# Patient Record
Sex: Female | Born: 1960 | Hispanic: Yes | Marital: Married | Smoking: Never smoker
Health system: Southern US, Community
[De-identification: ages and names within clinical notes are randomized; demographics above are authoritative.]

## PROBLEM LIST (undated history)

## (undated) DIAGNOSIS — I10 Essential (primary) hypertension: Secondary | ICD-10-CM

## (undated) DIAGNOSIS — E669 Obesity, unspecified: Secondary | ICD-10-CM

## (undated) HISTORY — PX: SHOULDER SURGERY: SHX246

---

## 1999-06-06 ENCOUNTER — Emergency Department (HOSPITAL_COMMUNITY): Admission: EM | Admit: 1999-06-06 | Discharge: 1999-06-06 | Payer: Self-pay | Admitting: Emergency Medicine

## 1999-06-20 ENCOUNTER — Encounter: Admission: RE | Admit: 1999-06-20 | Discharge: 1999-06-20 | Payer: Self-pay | Admitting: Obstetrics & Gynecology

## 1999-10-07 ENCOUNTER — Emergency Department (HOSPITAL_COMMUNITY): Admission: EM | Admit: 1999-10-07 | Discharge: 1999-10-07 | Payer: Self-pay | Admitting: Emergency Medicine

## 1999-10-10 ENCOUNTER — Encounter: Admission: RE | Admit: 1999-10-10 | Discharge: 1999-10-10 | Payer: Self-pay | Admitting: Obstetrics & Gynecology

## 1999-10-18 ENCOUNTER — Encounter: Payer: Self-pay | Admitting: Emergency Medicine

## 1999-10-18 ENCOUNTER — Emergency Department (HOSPITAL_COMMUNITY): Admission: EM | Admit: 1999-10-18 | Discharge: 1999-10-18 | Payer: Self-pay | Admitting: Emergency Medicine

## 2004-03-20 ENCOUNTER — Emergency Department (HOSPITAL_COMMUNITY): Admission: EM | Admit: 2004-03-20 | Discharge: 2004-03-20 | Payer: Self-pay | Admitting: Emergency Medicine

## 2004-06-23 ENCOUNTER — Ambulatory Visit (HOSPITAL_BASED_OUTPATIENT_CLINIC_OR_DEPARTMENT_OTHER): Admission: RE | Admit: 2004-06-23 | Discharge: 2004-06-23 | Payer: Self-pay | Admitting: Orthopedic Surgery

## 2008-10-17 ENCOUNTER — Emergency Department (HOSPITAL_COMMUNITY): Admission: EM | Admit: 2008-10-17 | Discharge: 2008-10-17 | Payer: Self-pay | Admitting: Emergency Medicine

## 2009-05-24 ENCOUNTER — Emergency Department (HOSPITAL_COMMUNITY): Admission: EM | Admit: 2009-05-24 | Discharge: 2009-05-24 | Payer: Self-pay | Admitting: Emergency Medicine

## 2009-12-12 ENCOUNTER — Emergency Department (HOSPITAL_COMMUNITY): Admission: EM | Admit: 2009-12-12 | Discharge: 2009-12-12 | Payer: Self-pay | Admitting: Emergency Medicine

## 2009-12-14 ENCOUNTER — Emergency Department (HOSPITAL_COMMUNITY): Admission: EM | Admit: 2009-12-14 | Discharge: 2009-12-14 | Payer: Self-pay | Admitting: Emergency Medicine

## 2009-12-16 ENCOUNTER — Emergency Department (HOSPITAL_COMMUNITY): Admission: EM | Admit: 2009-12-16 | Discharge: 2009-12-16 | Payer: Self-pay | Admitting: Emergency Medicine

## 2010-12-08 LAB — URINALYSIS, ROUTINE W REFLEX MICROSCOPIC
Bilirubin Urine: NEGATIVE
Glucose, UA: NEGATIVE mg/dL
Ketones, ur: NEGATIVE mg/dL
Leukocytes, UA: NEGATIVE
Nitrite: NEGATIVE
Protein, ur: NEGATIVE mg/dL
Specific Gravity, Urine: 1.022 (ref 1.005–1.030)
Urobilinogen, UA: 0.2 mg/dL (ref 0.0–1.0)
pH: 6.5 (ref 5.0–8.0)

## 2010-12-08 LAB — DIFFERENTIAL
Basophils Relative: 0 % (ref 0–1)
Eosinophils Relative: 2 % (ref 0–5)
Monocytes Absolute: 1 10*3/uL (ref 0.1–1.0)
Monocytes Relative: 9 % (ref 3–12)
Neutro Abs: 5.7 10*3/uL (ref 1.7–7.7)

## 2010-12-08 LAB — CBC
HCT: 38.8 % (ref 36.0–46.0)
Hemoglobin: 13.3 g/dL (ref 12.0–15.0)
MCHC: 34.3 g/dL (ref 30.0–36.0)
MCV: 93 fL (ref 78.0–100.0)
RBC: 4.17 MIL/uL (ref 3.87–5.11)

## 2010-12-08 LAB — BASIC METABOLIC PANEL
CO2: 24 mEq/L (ref 19–32)
Calcium: 8.9 mg/dL (ref 8.4–10.5)
Chloride: 106 mEq/L (ref 96–112)
GFR calc Af Amer: >60 mL/min (ref >60)
Potassium: 3.7 mEq/L (ref 3.5–5.1)
Sodium: 137 mEq/L (ref 135–145)

## 2010-12-08 LAB — RPR: RPR Ser Ql: NONREACTIVE

## 2010-12-19 LAB — URINALYSIS, ROUTINE W REFLEX MICROSCOPIC
Bilirubin Urine: NEGATIVE
Glucose, UA: NEGATIVE mg/dL
Ketones, ur: NEGATIVE mg/dL
Leukocytes, UA: NEGATIVE
Nitrite: NEGATIVE
Protein, ur: NEGATIVE mg/dL
Specific Gravity, Urine: 1.019 (ref 1.005–1.030)
Urobilinogen, UA: 0.2 mg/dL (ref 0.0–1.0)
pH: 6 (ref 5.0–8.0)

## 2010-12-19 LAB — POCT I-STAT, CHEM 8
BUN: 8 mg/dL (ref 6–23)
Calcium, Ion: 1.18 mmol/L (ref 1.12–1.32)
Chloride: 106 mEq/L (ref 96–112)
Creatinine, Ser: 0.4 mg/dL (ref 0.4–1.2)
Glucose, Bld: 81 mg/dL (ref 70–99)
HCT: 37 % (ref 36.0–46.0)
Hemoglobin: 12.6 g/dL (ref 12.0–15.0)
Potassium: 4 mEq/L (ref 3.5–5.1)
Sodium: 140 mEq/L (ref 135–145)
TCO2: 22 mmol/L (ref 0–100)

## 2010-12-19 LAB — WET PREP, GENITAL
WBC, Wet Prep HPF POC: NONE SEEN
Yeast Wet Prep HPF POC: NONE SEEN

## 2010-12-19 LAB — POCT PREGNANCY, URINE: Preg Test, Ur: NEGATIVE

## 2010-12-19 LAB — GC/CHLAMYDIA PROBE AMP, GENITAL
Chlamydia, DNA Probe: NEGATIVE
GC Probe Amp, Genital: NEGATIVE

## 2011-01-19 NOTE — Op Note (Signed)
[name], [name] NO.:  [id number]   MEDICAL RECORD NO.:  [id number]          PATIENT TYPE:  AMB   LOCATION:  DSC                          FACILITY:  [facility]   PHYSICIAN:  Harvie Junior, M.D.   DATE OF BIRTH:  Apr 09, 1961   DATE OF PROCEDURE:  06/23/2004  DATE OF DISCHARGE:                                 OPERATIVE REPORT   PREOPERATIVE DIAGNOSES:  1.  Impingement.  2.  Acromioclavicular joint arthritis.  3.  Partial-thickness rotator cuff tear.   POSTOPERATIVE DIAGNOSES:  1.  Impingement.  2.  Acromioclavicular joint arthritis.  3.  Partial-thickness rotator cuff tear.  4.  Labral pathology with probable old biceps tendon tear.   PRINCIPAL PROCEDURES:  1.  Anterolateral acromioplasty through a lateral and posterior compartment.  2.  Distal clavicle resection through an anterior compartment.  3.  Debridement of superior labral tear and biceps tendon and undersurface      rotator cuff tear from within the glenohumeral joint.   SURGEON:  Harvie Junior, M.D.   ASSISTANT:  Marshia Ly, P.A.   ANESTHESIA:  General.   BRIEF HISTORY:  Ms. Colleen Jacobson is a 50 year old female with a long history of  having right shoulder pain.  She was ultimately evaluated multiple times and  noted to have impingement with pain in the shoulder.  She ultimately was  found to have a tight subacromial space.  MRI showed significant partial-  thickness rotator cuff tearing with questionable biceps tendon tear and  after failure of all conservative care, she was ultimately taken to the  operating room for operative arthroscopy with __________ as needed.   PROCEDURE:  The patient was taken to the operating room and after adequate  level of anesthesia was obtained with a general anesthetic, the patient was  positioned upon the operating table and moved to the beach chair position,  and all bony prominences were well-padded.  Attention was then turned to the  right shoulder, where  after the arm was prepped and draped in the usual  sterile fashion, a routine arthroscopic examination revealed that there was  significant anterior spurring in the acromion, and this was debrided with a  motorized bur.  The bur was used to elevate the acromion from lateral to  medial and once this was accomplished, attention was turned to the distal  clavicle, where 15 mm of distal clavicle was resected through the anterior  portal.  Following this, attention was turned to the rotator cuff superior  surface, where there was noted to be significant fray of the rotator cuff.  Checks were made for any full-thickness areas of rotator cuff, and none were  seen.  At this time attention was turned into the glenohumeral joint.  In  the glenohumeral joint there was noted to be some significant labral  pathology a well as undersurface rotator cuff tearing.  The rotator cuff was  debrided from within the glenohumeral joint, and no full-thickness tearing  was identified at this point.  The biceps tendon was identified and looked  as though it was adherent to the rotator cuff  and that it did not proceed  distally through the interval on the rotator cuff where the biceps tendon  departs the shoulder.  The superior labrum was also torn, and this was  debrided and the biceps tendon actually was left alone, adherent basically  in the subscap rotator interval.  At this point a final check was made of  the rotator cuff undersurface.  There was again significantly intact rotator  cuff and at this point, attention was turned out of the glenohumeral joint  and the portals were closed with a bandage, a sterile compressive dressing  was applied, and the patient was taken to the recovery room, where she was  noted to be in satisfactory condition.  Estimated blood loss for the  procedure was none.       JLG/MEDQ  D:  06/23/2004  T:  06/23/2004  Job:  [id number]

## 2011-08-13 ENCOUNTER — Emergency Department (HOSPITAL_COMMUNITY): Payer: Self-pay

## 2011-08-13 ENCOUNTER — Emergency Department (HOSPITAL_COMMUNITY)
Admission: EM | Admit: 2011-08-13 | Discharge: 2011-08-14 | Disposition: A | Discharge status: Home / Self Care | Payer: Self-pay | Attending: Emergency Medicine | Admitting: Emergency Medicine

## 2011-08-13 ENCOUNTER — Encounter: Payer: Self-pay | Admitting: *Deleted

## 2011-08-13 DIAGNOSIS — M545 Low back pain, unspecified: Secondary | ICD-10-CM | POA: Insufficient documentation

## 2011-08-13 DIAGNOSIS — R319 Hematuria, unspecified: Secondary | ICD-10-CM | POA: Insufficient documentation

## 2011-08-13 DIAGNOSIS — R300 Dysuria: Secondary | ICD-10-CM | POA: Insufficient documentation

## 2011-08-13 LAB — URINALYSIS, ROUTINE W REFLEX MICROSCOPIC
Glucose, UA: NEGATIVE mg/dL
Specific Gravity, Urine: 1.023 (ref 1.005–1.030)
pH: 6 (ref 5.0–8.0)

## 2011-08-13 LAB — URINE MICROSCOPIC-ADD ON

## 2011-08-13 MED ORDER — KETOROLAC TROMETHAMINE 60 MG/2ML IM SOLN
60.0000 mg | Freq: Once | INTRAMUSCULAR | Status: AC
  Administered 2011-08-13: 60 mg via INTRAMUSCULAR
  Filled 2011-08-13: qty 2

## 2011-08-13 MED ORDER — OXYCODONE-ACETAMINOPHEN 5-325 MG PO TABS
1.0000 | ORAL_TABLET | Freq: Once | ORAL | Status: AC
  Administered 2011-08-13: 1 via ORAL
  Filled 2011-08-13: qty 1

## 2011-08-13 NOTE — ED Provider Notes (Signed)
History     CSN: [id number] Arrival date & time: 08/13/2011  7:24 PM   First MD Initiated Contact with Patient 08/13/11 2144      Chief Complaint  Patient presents with  . Back Pain    pt c/o lower right back pain that began 6 days ago. pt denies injury. pt reports "inflammation" when attempting urination of defecation.     (Consider location/radiation/quality/duration/timing/severity/associated sxs/prior treatment) HPI History provided by pt.   Pt has had severe pain right lower back for the past 6 days.  Radiates to right lower abd.  Aggravated by urination.  Denies fever, N/V, urinary and vaginal sx.  No known injury or recent heavy lifting.  H/o kidney stone and this pain feels similar w/ exception that it was isolated to back last time.  History reviewed. No pertinent past medical history.  History reviewed. No pertinent past surgical history.  History reviewed. No pertinent family history.  History  Substance Use Topics  . Smoking status: Never Smoker   . Smokeless tobacco: Not on file  . Alcohol Use: No    OB History    Grav Para Term Preterm Abortions TAB SAB Ect Mult Living                  Review of Systems  All other systems reviewed and are negative.    Allergies  Review of patient's allergies indicates no known allergies.  Home Medications   Current Outpatient Rx  Name Route Sig Dispense Refill  . IBUPROFEN 200 MG PO TABS Oral Take 600 mg by mouth every 6 (six) hours as needed. For pain.       BP 142/77  Pulse 85  Temp(Src) 98.8 F (37.1 C) (Oral)  Resp 18  Wt 200 lb (90.719 kg)  SpO2 98%  Physical Exam  Nursing note and vitals reviewed. Constitutional: She is oriented to person, place, and time. She appears well-developed and well-nourished.  HENT:  Head: Normocephalic and atraumatic.  Eyes:       Normal appearance  Neck: Normal range of motion.  Cardiovascular: Normal rate and regular rhythm.   Pulmonary/Chest: Effort normal and  breath sounds normal.  Abdominal: Soft. Bowel sounds are normal. She exhibits no distension.       Obese; mild ttp RLQ  Genitourinary:       Nml external genitalia.  No vaginal discharge/bleeding.  Cervix closed and appears nml.  No cervical motion tenderness.  Mild right adnexal tenderness.  Pt reports that it reproduces her pain and can feel it in her back as well.    Musculoskeletal:       Lumbar spine non-tender. R CVA ttp. Full ROM of LE.  Nml patellar reflexes.  Distal sensation intact.  2+ DP pulses.    Neurological: She is alert and oriented to person, place, and time.  Skin: Skin is warm and dry. No rash noted.  Psychiatric: She has a normal mood and affect. Her behavior is normal.    ED Course  Procedures (including critical care time)  Labs Reviewed  URINALYSIS, ROUTINE W REFLEX MICROSCOPIC - Abnormal; Notable for the following:    Hgb urine dipstick MODERATE (*)    All other components within normal limits  WET PREP, GENITAL - Abnormal; Notable for the following:    Clue Cells, Wet Prep FEW (*)    All other components within normal limits  URINE MICROSCOPIC-ADD ON  GC/CHLAMYDIA PROBE AMP, GENITAL   Ct Abdomen Pelvis Wo Contrast  08/13/2011  *  RADIOLOGY REPORT*  Clinical Data: Right-sided pain.  Rule out stones.  CT ABDOMEN AND PELVIS WITHOUT CONTRAST  Technique:  Multidetector CT imaging of the abdomen and pelvis was performed following the standard protocol without intravenous contrast.  Comparison: 05/24/2009  Findings: Mild dependent atelectasis in the lung bases.  4 mm stone in the upper pole of the left kidney.  The kidneys are otherwise symmetrical.  No pyelocaliectasis or ureterectasis.  No stones identified in the right kidney, renal collecting systems, ureters, or the bladder.  Unenhanced images of the liver, spleen, contracted gallbladder, pancreas, adrenal glands, abdominal aorta, and retroperitoneal lymph nodes are unremarkable.  The stomach and small bowel are  decompressed.  No free fluid or free air in the abdomen.  Stool filled colon without significant wall thickening or distension. Small umbilical hernia containing fat.  Pelvis:  The uterus and adnexal structures are not enlarged. Calcified phlebolith.  Bladder wall is not thickened.  No free or loculated pelvic fluid collections.  Scattered diverticula in the sigmoid colon without inflammatory process.  The appendix is normal.  Degenerative changes in the lumbar spine.  IMPRESSION: Nonobstructing stone in the upper pole of the left kidney.  No ureteral stone or obstruction.  Small umbilical hernia containing fat.  Original Report Authenticated By: Marlon Pel, M.D.     1. Low back pain   2. Hematuria       MDM  Pt w/ h/o kidney stone but otherwise healthy presents w/ non-traumatic right low back pain that radiates to RLQ.  Feels similar to past kidney stone w/ exception of location.  On initial exam, afebrile, right CVA and mild RLQ ttp.  U/a pos for lg hgb.  CT abd/pelvis neg for ureteral stone. Appendix appears nml and ovaries nml size. Results discussed w/ pt.  On re-examination abd, diffuse ttp right abd but worse in right suprapubic.  Pt reports sharp pains in this location.  Pelvic performed and sig for mild right adnexal tenderness.   Wet prep unremarkable.  Pain may be musculoskeletal, passed kidney stone or less likely, ovarian pain.  Referred to urology as well as gynecology.  Prescribed vicodin for pain.  Return precautions discussed.         Arie Sabina [place], [place] 08/14/11 [phone]

## 2011-08-14 LAB — WET PREP, GENITAL: Trich, Wet Prep: NONE SEEN

## 2011-08-14 MED ORDER — HYDROCODONE-ACETAMINOPHEN 5-325 MG PO TABS: 1.0000 | ORAL_TABLET | ORAL | Status: AC | PRN

## 2011-08-15 NOTE — ED Provider Notes (Signed)
Medical screening examination/treatment/procedure(s) were performed by non-physician practitioner and as supervising physician I was immediately available for consultation/collaboration.  Aunika Kirsten L Jacques Fife, MD 08/15/11 2355 

## 2013-03-09 ENCOUNTER — Emergency Department (HOSPITAL_COMMUNITY)
Admission: EM | Admit: 2013-03-09 | Discharge: 2013-03-09 | Disposition: A | Discharge status: Home / Self Care | Payer: Self-pay | Attending: Emergency Medicine | Admitting: Emergency Medicine

## 2013-03-09 ENCOUNTER — Encounter (HOSPITAL_COMMUNITY): Payer: Self-pay | Admitting: Emergency Medicine

## 2013-03-09 ENCOUNTER — Emergency Department (HOSPITAL_COMMUNITY): Payer: Self-pay

## 2013-03-09 DIAGNOSIS — Y990 Civilian activity done for income or pay: Secondary | ICD-10-CM | POA: Insufficient documentation

## 2013-03-09 DIAGNOSIS — R296 Repeated falls: Secondary | ICD-10-CM | POA: Insufficient documentation

## 2013-03-09 DIAGNOSIS — Y9289 Other specified places as the place of occurrence of the external cause: Secondary | ICD-10-CM | POA: Insufficient documentation

## 2013-03-09 DIAGNOSIS — S63659A Sprain of metacarpophalangeal joint of unspecified finger, initial encounter: Secondary | ICD-10-CM | POA: Insufficient documentation

## 2013-03-09 DIAGNOSIS — S63509A Unspecified sprain of unspecified wrist, initial encounter: Secondary | ICD-10-CM | POA: Insufficient documentation

## 2013-03-09 DIAGNOSIS — Y9389 Activity, other specified: Secondary | ICD-10-CM | POA: Insufficient documentation

## 2013-03-09 MED ORDER — IBUPROFEN 200 MG PO TABS: 600.0000 mg | ORAL_TABLET | Freq: Four times a day (QID) | ORAL | Status: DC | PRN

## 2013-03-09 NOTE — ED Provider Notes (Signed)
History  This chart was scribed for Antony Madura - PA by Manuela Schwartz, ED scribe. This patient was seen in room WTR6/WTR6 and the patient's care was started at 1850.  CSN: [id number] Arrival date & time 03/09/13  3086  First MD Initiated Contact with Patient 03/09/13 1850     Chief Complaint  Patient presents with  . Fall  . Wrist Pain    left   Patient is a 52 y.o. female presenting with fall and wrist pain. The history is provided by the patient. No language interpreter was used.  Fall This is a new problem. The current episode started more than 2 days ago. The problem occurs constantly. The problem has not changed since onset.Exacerbated by: pressure to her wrist. Nothing relieves the symptoms. She has tried a cold compress, a warm compress and ASA for the symptoms. The treatment provided mild relief.  Wrist Pain This is a new problem. The current episode started more than 2 days ago. The problem occurs constantly. The problem has not changed since onset.Nothing aggravates the symptoms. Nothing relieves the symptoms. She has tried nothing for the symptoms.  Fall This is a new problem. The current episode started more than 2 days ago. The problem occurs constantly. The problem has not changed since onset.Associated symptoms include arthralgias, joint swelling and myalgias. Pertinent negatives include no fever, numbness or weakness. Exacerbated by: pressure to her wrist. She has tried a cold compress, a warm compress and ASA for the symptoms. The treatment provided mild relief.  Wrist Pain This is a new problem. The current episode started more than 2 days ago. The problem occurs constantly. The problem has not changed since onset.Associated symptoms include arthralgias, joint swelling and myalgias. Pertinent negatives include no fever, numbness or weakness. Nothing aggravates the symptoms. She has tried nothing for the symptoms.   HPI Comments: Regina Ganci is a 52 y.o. female who  presents to the Emergency Department complaining of constant, aching, left wrist pain after a fall at work w/outstretched left arm 1 week ago. She states pain is worse with applying pressure especially to volar aspect of her left wrist and thenar eminence. She has tried ibuprofen and ice/heat packs w/minimal relief of pain. She has not yet seen anyone for this problem. She denies any other injuries.  History reviewed. No pertinent past medical history. History reviewed. No pertinent past surgical history. No family history on file. History  Substance Use Topics  . Smoking status: Never Smoker   . Smokeless tobacco: Not on file  . Alcohol Use: No   OB History   Grav Para Term Preterm Abortions TAB SAB Ect Mult Living                 Review of Systems  Constitutional: Negative for fever.  Musculoskeletal: Positive for myalgias, joint swelling and arthralgias. Negative for back pain.       Left wrist pain/swelling  Skin: Negative for color change and pallor.  Neurological: Negative for syncope, weakness and numbness.  All other systems reviewed and are negative.  A complete 10 system review of systems was obtained and all systems are negative except as noted in the HPI and PMH.   Allergies  Review of patient's allergies indicates no known allergies.  Home Medications   Current Outpatient Rx  Name  Route  Sig  Dispense  Refill  . ibuprofen (ADVIL,MOTRIN) 200 MG tablet   Oral   Take 3 tablets (600 mg total) by mouth every  6 (six) hours as needed. For pain.   30 tablet   0    Triage Vitals: BP 147/78  Pulse 98  Temp(Src) 98.8 F (37.1 C) (Oral)  Resp 19  SpO2 97% Physical Exam  Nursing note and vitals reviewed. Constitutional: She is oriented to person, place, and time. She appears well-developed and well-nourished. No distress.  HENT:  Head: Normocephalic and atraumatic.  Eyes: Conjunctivae and EOM are normal. No scleral icterus.  Neck: Normal range of motion.   Cardiovascular: Normal rate, regular rhythm and intact distal pulses.   Distal radial pulses 2+ bilaterally. Capillary refill normal.  Pulmonary/Chest: Effort normal. No respiratory distress.  Musculoskeletal: Normal range of motion. She exhibits tenderness.  TTP over 1st and 2nd MCP joints of left hand and TTP to DIP of left thumb. 5/5 strength against resistance of fdp fds, and extensors of left hand. No swelling, redness, or heat to touch.   Neurological: She is alert and oriented to person, place, and time.  No sensory or motor deficits appreciated. Finger to thumb opposition intact and left hand  Skin: Skin is warm and dry. No rash noted. She is not diaphoretic. No erythema. No pallor.  Psychiatric: She has a normal mood and affect. Her behavior is normal.   ED Course  Procedures (including critical care time) DIAGNOSTIC STUDIES: Oxygen Saturation is 97% on room air, normal by my interpretation.    COORDINATION OF CARE: At 58 PM Discussed treatment plan with patient which includes left hand X-ray. Patient agrees.   Labs Reviewed - No data to display Dg Hand Complete Left  03/09/2013   *RADIOLOGY REPORT*  Clinical Data: Fall.  Left hand injury and pain.  LEFT HAND - COMPLETE 3+ VIEW  Comparison: None.  Findings: No evidence of fracture or dislocation.  No other significant bone abnormality identified.  Soft tissues are unremarkable.  IMPRESSION: No acute findings.   Original Report Authenticated By: Myles Rosenthal, M.D.   1. Hand sprain and strain, left, initial encounter    MDM  Uncomplicated sprain of left wrist and thenar eminence secondary to fall at work. Patient neurovascularly intact; physical exam as above. No erythema or heat-to-touch to suspect complicated or infectious joint process. X-ray with no findings of fracture or dislocation. Thumb spica splint applied in ED for stability. Patient appropriate for discharge with instruction to take ibuprofen as needed and to apply ice  at least 4 times day for inflammation. Orthopedic referral provided. Indications for ED return discussed with the patient verbalized comfort and understanding with this discharge plan.  I personally performed the services described in this documentation, which was scribed in my presence. The recorded information has been reviewed and is accurate.     Antony Madura, PA-C 03/10/13 0140

## 2013-03-09 NOTE — ED Notes (Signed)
Pt fell last Tuesday, pt c/o left wrist pain.

## 2013-03-09 NOTE — Discharge Instructions (Signed)
Recommend that you where your thumb and wrist brace for added stability. Take Tylenol as prescribed for pain. Apply ice to the affected area for the first 2 days followed by alternating ice and heat packs. Followup with your primary care provider as needed and follow up with a hand specialist in 7-10 days if symptoms do not begin to improve. Return to the emergency department if symptoms worsen.  Intermetacarpal Sprain The intermetacarpal ligaments run between the knuckles, at the base of the fingers. These ligaments are vulnerable to sprain and injury, in which the ligament becomes over stretched or torn. Intermetacarpal sprains are classified into 3 categories. Grade 1 sprains cause pain, but the tendon is not lengthened. Grade 2 sprains include a lengthened ligament, due to the ligament being stretched or partially ruptured. With grade 2 sprains there is still function, although function may be decreased. Grade 3 sprains include a complete tear of the ligament, and the joint usually displays a loss of function.  SYMPTOMS   Severe pain at the time of injury.  Often, a feeling of popping or tearing inside the hand.  Tenderness and inflammation at the knuckles.  Bruising within a couple days of injury.  Impaired ability to use the hand. CAUSES  This condition occurs when the intermeatacarpal ligaments are subjected to a greater stress than they can handle. This causes the ligaments to become stretched or torn. RISK INCREASES WITH:  Previous hand injury.  Fighting sports (boxing, wrestling, martial arts).  Sports in which you could fall on an outstretched hand (soccer, basketball, volleyball).  Other sports with repeated hand trauma (water polo, gymnastics).  Poor hand strength and flexibility.  Inadequate or poorly fitted protective equipment. PREVENTION   Warm up and stretch properly before activity.  Maintain appropriate conditioning:  Hand flexibility.  Muscle strength and  endurance.  Applying tape, protective strapping, or a brace may help prevent injury.  Provide the hand with support during sports and practice activities, for 6 to 12 months following injury. PROGNOSIS  With proper treatment, healing should occur without impairment. The length of healing varies from 2 to 12 weeks, depending on the severity of injury. RELATED COMPLICATIONS   Longer healing time, if activities are resumed too soon.  Recurring symptoms or repeated injury, resulting in a chronic problem.  Injury to other nearby structures (bone, cartilage, tendon).  Arthritis of the knuckle (intermetacarpal) joint, with repeated sprains.  Prolonged disability (sometimes).  Hand and finger stiffness or weakness. TREATMENT Treatment first involves ice and medicine, to reduce pain and inflammation. An elastic compression bandage may be worn, to reduce discomfort and to protect the area. Depending on the severity of injury, you may be required to restrain the area with a cast, splint, or brace. After the ligament has been allowed to heal, strengthening and stretching exercises may be needed, to regain strength and a full range of motion. Exercises may be completed at home or with a therapist. Surgery is rarely needed. MEDICATION   If pain medicine is needed, nonsteroidal anti-inflammatory medicines (aspirin and ibuprofen), or other minor pain relievers (acetaminophen), are often advised.  Do not take pain medicine for 7 days before surgery.  Stronger pain relievers may be prescribed, if your caregiver thinks they are needed. Use only as directed and only as much as you need. HEAT AND COLD  Cold treatment (icing) should be applied for 10 to 15 minutes every 2 to 3 hours for inflammation and pain, and immediately after activity that aggravates your symptoms.  Use ice packs or an ice massage.  Heat treatment may be used before performing stretching and strengthening activities prescribed by your  caregiver, physical therapist, or athletic trainer. Use a heat pack or a warm water soak. SEEK MEDICAL CARE IF:   Symptoms remain or get worse, despite treatment for longer than 2 to 4 weeks.  You experience pain, numbness, discoloration, or coldness in the hand or fingers.  You develop blue, gray, or dark fingernails.  Any of the following occur after surgery: increased pain, swelling, redness, drainage of fluids, bleeding in the affected area, or signs of infection, including fever.  New, unexplained symptoms develop. (Drugs used in treatment may produce side effects.) Document Released: 08/20/2005 Document Revised: 11/12/2011 Document Reviewed: 12/02/2008 [facility] Patient Information 2014 [place], [place]. RESOURCE GUIDE  Chronic Pain Problems: Contact [facility]  [phone] Patients need to be referred by their primary care doctor.  Insufficient Money for Medicine: Contact United Way:  call "211."   No Primary Care Doctor: - Call [facility]  [phone] - can help you locate a primary care doctor that  accepts your insurance, provides certain services, etc. - Physician Referral Service- [phone]  Agencies that provide inexpensive medical care: - [facility]  [phone] - [facility]  [phone] - [facility]  [phone] - Women's Clinic  [phone] - Planned Parenthood  [phone] [facility]  [phone]  Medicaid-accepting [facility] Providers: - [facility]- [address]  [phone], Mon-Fri 9am-7pm, Sat 9am-1pm - [facility]- [address] [place], [address] [place]  [phone] - [facility]- [address] [place]  [phone] [facility]- [address]  [phone] - Renaye Rakers- [address] [place], [address], [phone]  Only accepts [place] patients after they have their name  applied to  their card  Self Pay (no insurance) in [place]: - Sickle Cell Patients - [facility]  [address] [place], [phone] - [facility]- [address] [place]  [phone]       [facility]- [address]       -     [facility]- see information above (Speak to [employer] if you do not have insurance)       -  [facility]- [address],  [phone]       -  [facility]- [address] [phone]       -  Dr Julio Sicks-  [address], [place], [phone]       -  [facility] - [address], [phone]       -  [facility]- [address], [phone], also [address] [phone]       -     [facility]- [address] [place], [phone], 1st & 3rd Saturday         every month, 10am-1pm  -     [facility]   [address] [place], [place].   Phone:  [phone], Fax:  [phone]. Hours of Operation:  9 am - 6 pm, M-F.  -     [facility]   [address], [address], [address]  Phone: [phone], Fax: [phone]. Hours of Operation:  8:30 am - 5:30 pm, M-F.  [facility] [address] [place], [place] [address] [phone]  [facility] [address] [place], [place] [address] [phone]  1) Find a Doctor and Pay Out of Pocket Although you won't have to find out who is covered by your insurance plan, it is a good idea to ask around and get recommendations. You will then need to call the office and see if the doctor you have chosen will accept you as a new patient and what types of options they offer for patients who are self-pay. Some doctors offer discounts or will set up payment plans for their patients who do not have insurance, but you will need to ask so you aren't surprised when you get to your appointment.  2) Contact Your  Local Health Department Not all health departments have doctors that can see patients for sick visits, but many do, so it is worth a call to see if yours does. If you don't know where your local health department is, you can check in your phone book. The CDC also has a tool to help you locate your state's health department, and many state websites also have listings of all of their local health departments.  3) Find a Walk-in Clinic If your illness is not likely to be very severe or complicated, you may want to try a walk in clinic. These are popping up all over the country in pharmacies, drugstores, and shopping centers. They're usually staffed by nurse practitioners or physician assistants that have been trained to treat common illnesses and complaints. They're usually fairly quick and inexpensive. However, if you have serious medical issues or chronic medical problems, these are probably not your best option  STD Testing - [facility] Department of [facility], STD Clinic, [address] [place], phone [phone] or [phone].  Monday - Friday, call for an appointment. [facility] Department of [employer], STD Clinic, [address], [place], phone [phone] or [phone].  Monday - Friday, call for an appointment.  Abuse/Neglect: [facility] Child Abuse Hotline [phone] [facility] Child Abuse Hotline [phone] (After Hours)  Emergency Shelter:  [facility] [phone]  Maternity Homes: - [facility] at [facility] [place] [facility] [phone] - [facility] [phone]  MRSA Hotline #:   332-852-3970  Dental Assistance If unable to pay or uninsured, contact:  [facility]. to become qualified for the adult dental clinic.  Patients with Medicaid: [facility] [phone], [phone] [address], [phone]  If unable to pay, or  uninsured, contact [facility] [phone] in [place], [phone] in [facility]) to become qualified for the adult dental clinic  [facility] [address] [place], [place] [address] [phone] [email or web address]  Other Proofreader Services: - [facility]- [address] [place], [place], [place], [address], [phone], Ext. [phone], 2nd and 4th Thursday of the month at 6:30am.  10 clients each day by appointment, can sometimes see walk-in patients if someone does not show for an appointment. [facility]- [address] [place], [place], [address], [phone] - [facility]- [address] [place], [place], [address], [phone] - [facility]- [phone] [facility]- [phone] [facility]- [phone]

## 2013-03-10 NOTE — ED Provider Notes (Signed)
Medical screening examination/treatment/procedure(s) were performed by non-physician practitioner and as supervising physician I was immediately available for consultation/collaboration.   Siyah Mault M Azriella Mattia, MD 03/10/13 2103 

## 2014-01-22 ENCOUNTER — Emergency Department (HOSPITAL_COMMUNITY): Payer: Self-pay

## 2014-01-22 ENCOUNTER — Encounter (HOSPITAL_COMMUNITY): Payer: Self-pay | Admitting: Emergency Medicine

## 2014-01-22 ENCOUNTER — Emergency Department (HOSPITAL_COMMUNITY)
Admission: EM | Admit: 2014-01-22 | Discharge: 2014-01-23 | Disposition: A | Discharge status: Home / Self Care | Payer: Self-pay | Attending: Emergency Medicine | Admitting: Emergency Medicine

## 2014-01-22 DIAGNOSIS — S8392XA Sprain of unspecified site of left knee, initial encounter: Secondary | ICD-10-CM

## 2014-01-22 DIAGNOSIS — W1809XA Striking against other object with subsequent fall, initial encounter: Secondary | ICD-10-CM | POA: Insufficient documentation

## 2014-01-22 DIAGNOSIS — IMO0002 Reserved for concepts with insufficient information to code with codable children: Secondary | ICD-10-CM | POA: Insufficient documentation

## 2014-01-22 DIAGNOSIS — Y9301 Activity, walking, marching and hiking: Secondary | ICD-10-CM | POA: Insufficient documentation

## 2014-01-22 DIAGNOSIS — Y92009 Unspecified place in unspecified non-institutional (private) residence as the place of occurrence of the external cause: Secondary | ICD-10-CM | POA: Insufficient documentation

## 2014-01-22 MED ORDER — OXYCODONE-ACETAMINOPHEN 5-325 MG PO TABS
2.0000 | ORAL_TABLET | Freq: Once | ORAL | Status: AC
  Administered 2014-01-22: 2 via ORAL
  Filled 2014-01-22: qty 2

## 2014-01-22 MED ORDER — METHOCARBAMOL 750 MG PO TABS: 750.0000 mg | ORAL_TABLET | Freq: Four times a day (QID) | ORAL | Status: DC

## 2014-01-22 MED ORDER — OXYCODONE-ACETAMINOPHEN 5-325 MG PO TABS
2.0000 | ORAL_TABLET | ORAL | Status: DC | PRN
Start: 2014-01-22 — End: 2014-02-19

## 2014-01-22 NOTE — Discharge Instructions (Signed)
Esguince de rodilla ( Knee Sprain) Un esguince de rodilla es un desgarro en uno de los tejidos fuertes y fibrosos (ligamentos) que conectan los huesos de la rodilla. La gravedad del esguince depende de cunto ligamento se rompe. La ruptura puede ser parcial o completa. CAUSAS  A menudo, los esguinces son el resultado de una cada o una lesin. La fuerza del impacto hace que las fibras del ligamento se estiren ms de su largo normal. Este exceso de tensin es la causa de que las fibras del ligamento se rompan. SIGNOS Y SNTOMAS  Es posible que pierda el movimiento de la rodilla. Otros sntomas son:  Moretones.  Dolor en la zona de la rodilla.  Sensibilidad de la rodilla al tacto.  Hinchazn. DIAGNSTICO  Para diagnosticar un esguince de rodilla, su mdico le har un examen fsico de la rodilla. Adems, puede indicarle que se haga una radiografa de la rodilla para asegurarse de que no haya huesos fracturados. TRATAMIENTO  Si el ligamento est parcialmente roto, el tratamiento, habitualmente, consiste en mantener la rodilla en una posicin fija (inmovilizacin) o en usar un soporte durante algunas semanas cuando realice actividades que requieran movimiento. Para ello, su mdico colocar un vendaje, un yeso o una frula para impedir que la rodilla se Armed forces technical officermueva y para que le brinde apoyo durante los movimientos hasta que esta se cure. En el caso de un ligamento parcialmente roto, el proceso de curacin generalmente demora de 4 a 6semanas. Si el ligamento est completamente roto, segn de qu ligamento se trate, podr necesitar una ciruga para volver a unirlo al hueso o para Copywriter, advertisingreconstruirlo. Despus de la ciruga, Musicianle colocarn un yeso o una frula que no podr quitarse durante 4 a 6semanas mientras el ligamento se [place]. INSTRUCCIONES PARA EL CUIDADO EN EL HOGAR  Mantenga elevada la rodilla lesionada para disminuir la hinchazn.  Para aliviar el dolor y la hinchazn, aplique hielo en la zona de la  lesin:  Ponga el hielo en una bolsa plstica.  Colquese una toalla entre la piel y la bolsa de hielo.  Deje el hielo durante 20 minutos, 2 a 3 veces por da.  Tome los analgsicos nicamente como le indic su mdico.  No deje la rodilla sin proteccin hasta que el dolor y la rigidez desaparezcan (generalmente en el trmino de 4 a 6semanas).  Si tiene puesto un yeso o una frula, no deje que se moje. Si le han indicado que no puede quitrselo, cbralo con una bolsa plstica para darse Neomia Dearuna ducha o un bao. No practique natacin.  Su mdico puede indicarle ejercicios para que haga durante la recuperacin, a fin de Agricultural engineerevitar o limitar la debilidad y la rigidez permanentes. SOLICITE ATENCIN MDICA DE INMEDIATO SI:  El yeso o la frula se daan.  El dolor Moscow Millsempeora.  Tiene dolor intenso, hinchazn o adormecimiento debajo del yeso o la frula. ASEGRESE DE QUE:  Comprende estas instrucciones.  Controlar su afeccin.  Recibir ayuda de inmediato si no mejora o si empeora. Document Released: 08/20/2005 Document Revised: 06/10/2013 [facility] Patient Information 2014 [place], [place]. Uso de Murphy Oilmuletas Administrator(Crutch Use) Las Murphy Oilmuletas se utilizan para Paramedicaliviar el peso de [place] de sus piernas o pies cuando est parado o camina. Es importante usar Pulte Homesmuletas que le calcen Flovillaadecuadamente. Las Murphy Oilmuletas calzan adecuadamente cuando:  Debe haber un espacio de 2 a 3 dedos entre cada muleta y Management consultantla axila.  El peso debe recaer en su mano y no en la axila.  RIESGOS Y COMPLICACIONES Gardiner RamusDao a los  nervios que se extiende desde la axila hacia la mano y el brazo. Para evitar que esto suceda, asegrese de que las [place] y no aplique presin en la axila cuando las [place]. CMO USAR SUS MULETAS Si le han indicado que soporte parcialmente el peso, aplique (soporte) la cantidad de peso que le sugiera su mdico. No soporte un peso que le ocasione dolor en la zona lesionada. Caminar 1. Prese con las  muletas. 2. Balancee la pierna sana levemente por delante de las muletas. Subir escalones Si no hay barandas: 1. Suba con la pierna sana. 2. Suba con las [place] y la pierna lesionada. 3. Contine de esta forma. Si hay barandas: 1. Sostenga ambas muletas en [employer]. 2. Coloque la [employer] baranda. 3. Mientras sostiene el peso con los brazos, levante la pierna sana y colquela sobre el escaln. 4. Lleve las muletas y la pierna lesionada hasta el escaln. 5. Contine de esta forma. Going Down Steps Tenga mucho cuidado, ya que bajar escaleras con muletas puede ser peligroso. Si no hay barandas: 1. Baje con la pierna lesionada y las [place]. 2. Baje con la pierna sana. Si hay barandas: 1. Coloque la [employer] baranda. 2. Sostenga ambas muletas con la [place] [employer]. 3. Baje la pierna lesionada y la [address] el escaln debajo de usted. Asegrese de [employer] puntas de las muletas en el centro del escaln, nunca en el borde. 4. Baje la pierna sana hasta ese escaln. 5. Contine de esta forma. Pararse 1. Sostenga la pierna lesionada hacia adelante. 2. Agarre el apoyabrazos con Edison Simon y la parte superior de las muletas con la [place]. 3. Utilizando estos apoyos, prese. Sentarse 1. Sostenga la pierna lesionada hacia adelante. 2. Agarre el apoyabrazos con Edison Simon y la parte superior de las muletas con la [place]. 3. Baje el cuerpo Printmaker. SOLICITE ATENCIN MDICA SI:  An se siente inestable cuando se para.  Desarrolla un nuevo dolor, por ejemplo, en las axilas, la espalda, los hombros, las muecas o la cadera.  Presenta adormecimiento u hormigueo. SOLICITE ATENCIN MDICA DE INMEDIATO SI: Se cae. Document Released: 08/20/2005 Document Revised: 06/10/2013 [facility] Patient Information 2014 [place], [place].

## 2014-01-22 NOTE — ED Provider Notes (Signed)
CSN: [id number]     Arrival date & time 01/22/14  2135 History   First MD Initiated Contact with Patient 01/22/14 2208     Chief Complaint  Patient presents with  . Leg Pain    bilateral     (Consider location/radiation/quality/duration/timing/severity/associated sxs/prior Treatment) Patient is a 53 y.o. female presenting with leg pain. The history is provided by a relative and the patient.  Leg Pain  patient here complaining of severe pain to her left knee and distal left thigh. She was walking at home and she tripped and fell and struck her left lower extremity. Denies any hip pain. No back pain. There was no loss of consciousness. Took Motrin without relief. Pain characterized as sharp and worse with movement. No prior history of same.  History reviewed. No pertinent past medical history. Past Surgical History  Procedure Laterality Date  . Shoulder surgery     History reviewed. No pertinent family history. History  Substance Use Topics  . Smoking status: Never Smoker   . Smokeless tobacco: Not on file  . Alcohol Use: No   OB History   Grav Para Term Preterm Abortions TAB SAB Ect Mult Living                 Review of Systems  All other systems reviewed and are negative.     Allergies  Review of patient's allergies indicates no known allergies.  Home Medications   Prior to Admission medications   Medication Sig Start Date End Date Taking? Authorizing Provider  ibuprofen (ADVIL,MOTRIN) 200 MG tablet Take 400 mg by mouth every 6 (six) hours as needed for moderate pain.   Yes Historical Provider, MD   BP 143/75  Pulse 94  Temp(Src) 98.4 F (36.9 C) (Oral)  Resp 20  SpO2 98% Physical Exam  Nursing note and vitals reviewed. Constitutional: She is oriented to person, place, and time. She appears well-developed and well-nourished.  Non-toxic appearance. No distress.  HENT:  Head: Normocephalic and atraumatic.  Eyes: Conjunctivae, EOM and lids are normal. Pupils are  equal, round, and reactive to light.  Neck: Normal range of motion. Neck supple. No tracheal deviation present. No mass present.  Cardiovascular: Normal rate, regular rhythm and normal heart sounds.  Exam reveals no gallop.   No murmur heard. Pulmonary/Chest: Effort normal and breath sounds normal. No stridor. No respiratory distress. She has no decreased breath sounds. She has no wheezes. She has no rhonchi. She has no rales.  Abdominal: Soft. Normal appearance and bowel sounds are normal. She exhibits no distension. There is no tenderness. There is no rebound and no CVA tenderness.  Musculoskeletal: She exhibits no edema and no tenderness.       Left knee: She exhibits decreased range of motion. She exhibits no effusion and no deformity.       Legs: Neurological: She is alert and oriented to person, place, and time. She has normal strength. No cranial nerve deficit or sensory deficit. GCS eye subscore is 4. GCS verbal subscore is 5. GCS motor subscore is 6.  Skin: Skin is warm and dry. No abrasion and no rash noted.  Psychiatric: She has a normal mood and affect. Her speech is normal and behavior is normal.    ED Course  Procedures (including critical care time) Labs Review Labs Reviewed - No data to display  Imaging Review No results found.   EKG Interpretation None      MDM   Final diagnoses:  None  Patient given Percocet for pain and she feels better. We'll discharge home with orthopedic referral and a knee immobilizer on crutches.    Toy BakerAnthony T Shaleena Crusoe, MD 01/22/14 2330

## 2014-01-22 NOTE — ED Notes (Signed)
Patient is alert and oriented x3.  She is complaining of bilateral leg pain with more pain in  The upper left leg.  The patient states that she fell on a cord.  She denies any LOC.  Currently  She rates her pain 8 of 10.  And adds that she has more pain when she tries to walk and has To hold on to the wall.

## 2014-01-22 NOTE — ED Notes (Signed)
Patient speaks very little [place], patient's grand daughter at the bedside translating.

## 2014-02-19 ENCOUNTER — Encounter (HOSPITAL_COMMUNITY): Payer: Self-pay | Admitting: Emergency Medicine

## 2014-02-19 ENCOUNTER — Emergency Department (HOSPITAL_COMMUNITY)
Admission: EM | Admit: 2014-02-19 | Discharge: 2014-02-19 | Disposition: A | Discharge status: Home / Self Care | Payer: Self-pay | Attending: Emergency Medicine | Admitting: Emergency Medicine

## 2014-02-19 DIAGNOSIS — H6091 Unspecified otitis externa, right ear: Secondary | ICD-10-CM

## 2014-02-19 DIAGNOSIS — H60399 Other infective otitis externa, unspecified ear: Secondary | ICD-10-CM | POA: Insufficient documentation

## 2014-02-19 MED ORDER — CIPROFLOXACIN-DEXAMETHASONE 0.3-0.1 % OT SUSP
4.0000 [drp] | Freq: Once | OTIC | Status: AC
  Administered 2014-02-19: 4 [drp] via OTIC
  Filled 2014-02-19: qty 7.5

## 2014-02-19 MED ORDER — ACETAMINOPHEN 325 MG PO TABS
650.0000 mg | ORAL_TABLET | Freq: Once | ORAL | Status: AC
  Administered 2014-02-19: 650 mg via ORAL
  Filled 2014-02-19: qty 2

## 2014-02-19 MED ORDER — HYDROCODONE-ACETAMINOPHEN 5-325 MG PO TABS: 1.0000 | ORAL_TABLET | Freq: Four times a day (QID) | ORAL | Status: DC | PRN

## 2014-02-19 NOTE — Discharge Instructions (Signed)
You have been diagnosed with outer ear infection of your right ear.  Each day apply a wick into your eardrum deep enough that the medication can get to your ear drum but do not advance far enough that it can injure your eardrum.  Drop 3-4 drops of antibiotic into the wick and allow it to reach your ear.  After 20 minute you may remove the ear wick.  If after 3 days and you notice no improvement, please follow up with ENT provider for further care.  Take vicodin as needed for pain.    Otitis Externa (Otitis Externa)  La otitis externa es una infeccin bacteriana o por hongos en el conducto auditivo externo. Esta es el rea desde el tmpano hasta el exterior de la [place]. Tambin se la llama "odo de nadador". CAUSAS  Las posibles causas de la infeccin son:   Alen Bleacheradar en agua sucia.  Humedad que queda en el odo despus de nadar o baarse.  Lesin leve en la oreja (traumatismo).  Objetos atascados en el odo (cuerpo extrao).  Cortes o raspones (abrasiones) en la parte exterior de la [place]. SNTOMAS  En general, la primer sntoma de infeccin es la picazn en el canal auditivo. Ms tarde, los signos y las sntomas pueden ser hinchazn y enrojecimiento del conducto auditivo, dolor de odo, y supuracin de lquido de color blanco amarillento (pus). El Engineer, miningdolor de odo puede empeorar cuando tira el lbulo de la [place].  DIAGNSTICO  El Office Depotmdico le har un examen fsico. Podr tomar Lauris Poaguna muestra de lquido de la oreja y Engineer, manufacturingdetectar bacterias u hongos.  TRATAMIENTO  Las gotas antibiticas para los odos se administran generalmente entre 10 a [address] das. El tratamiento tambin puede ser analgsicos o corticoides para reducir la comezn y la hinchazn.  PREVENCIN   Mantenga el odo seco. Use la punta de una toalla para absorber el agua del canal auditivo despus de nadar o del bao.  Evite rascarse o poner objetos en el interior del odo. Esto puede daar el conducto auditivo externo o eliminar la cera protectora  que recubre el conducto. Esto facilita el crecimiento de las bacterias y hongos.  Evite Progress Energynadar en los lagos, en agua contaminada, o en las piscinas mal cloradas.  Puede usar las gotas para los odos hechas de alcohol y vinagre despus de Programmer, systemsnadar. Mezcle en partes iguales el vinagre blanco y el alcohol en una botella. Ponga 3 o 4 gotas en cada odo despus de nadar. INSTRUCCIONES PARA EL CUIDADO EN EL HOGAR   Aplique gotas de antibitico en el conducto auditivo segn lo indicado por su mdico.  Slo tome medicamentos de venta libre o recetados para Primary school teachercalmar el dolor, las molestias o bajar la fiebre segn las indicaciones de su mdico.  Si tiene diabetes, siga las instrucciones adicionales de Holly Hilltratamiento.  Cumpla con todas las visitas de control, segn le indique su mdico. SOLICITE ATENCIN MDICA SI:   Lance Mussiene fiebre.  Su odo contina rojo, hinchado, le duele o supura pus despus de 3 das.  El dolor, la hinchazn o el enrojecimiento empeoran.  Sufre un dolor intenso de Turkmenistancabeza.  Tiene en la zona detrs de la oreja que est roja, hinchada, le duele o est sensible. ASEGRESE DE QUE:   Comprende estas instrucciones.  Controlar su enfermedad.  Solicitar ayuda de inmediato si no mejora o si empeora. Document Released: 08/20/2005 Document Revised: 11/12/2011 [facility] Patient Information 2015 [place]Care, [place]. This information is not intended to replace advice given to you by your  health care provider. Make sure you discuss any questions you have with your health care provider. ° °

## 2014-02-19 NOTE — ED Notes (Signed)
Pt c/o R ear pain for the past week. Pt states pain is worse now and she has decreased hearing in her R ear. Pt denies drainage from ear. Pt denies any other symptoms. Pt alert, no acute distress.

## 2014-02-19 NOTE — ED Provider Notes (Signed)
CSN: [id number]     Arrival date & time 02/19/14  1449 History  This chart was scribed for Fayrene HelperBowie Tran, PA-C, working with Layla MawKristen N Ward, DO, by Gwenevere AbbotAlexis Brown ED Scribe. This patient was seen in room WTR7/WTR7 and the patient's care was started at 3:01PM.  Chief Complaint  Patient presents with  . Otalgia   The history is provided by the patient and a relative. No language interpreter was used.   HPI Comments: Colleen Jacobson is a 53 y.o. female who presents to the Emergency Department complaining of right ear pain for the last week that has been constant, but became more severe last night. Pt is accompanied by a spanish-speaking family member who is providing translation. Pt states that it feels as if there is air inside of her ear and there was drainage last night. She also reports having some hearing changes from the right ear. She states that she couldn't sleep last night due to pain. Pt has recently been swimming in a pool. She states that she took medication (ibuprofen) to relieve pain, but it has not modified pain. She also states that she attempted to clean her ear with a Q-tip but this was unsuccessful. Pt states that she generally uses a Q-tip every time she takes a shower. She complains that she did have associated chills last night. Pt states that the ear pain radiates to the right side of her head. Patient denies any issues with the left ear. Pt also denies rhinorrhea, sneezing, cough or fever. Pt denies any allergies to medication.    History reviewed. No pertinent past medical history. Past Surgical History  Procedure Laterality Date  . Shoulder surgery     No family history on file. History  Substance Use Topics  . Smoking status: Never Smoker   . Smokeless tobacco: Not on file  . Alcohol Use: No   OB History   Grav Para Term Preterm Abortions TAB SAB Ect Mult Living                 Review of Systems  Constitutional: Positive for chills. Negative for fever.   HENT: Positive for ear pain (right). Negative for rhinorrhea and sneezing.   Respiratory: Negative for cough.      Allergies  Review of patient's allergies indicates no known allergies.  Home Medications   Prior to Admission medications   Medication Sig Start Date End Date Taking? Authorizing Provider  ibuprofen (ADVIL,MOTRIN) 200 MG tablet Take 400 mg by mouth every 6 (six) hours as needed for moderate pain.   Yes Historical Provider, MD  HYDROcodone-acetaminophen (NORCO/VICODIN) 5-325 MG per tablet Take 1 tablet by mouth every 6 (six) hours as needed for moderate pain or severe pain. 02/19/14   Fayrene HelperBowie Tran, PA-C   Triage Vitals: BP 167/92  Pulse 84  Temp(Src) 99.2 F (37.3 C) (Oral)  Resp 16  SpO2 96%  Physical Exam  Nursing note and vitals reviewed. Constitutional: She is oriented to person, place, and time. She appears well-developed and well-nourished. No distress.  HENT:  Head: Normocephalic and atraumatic.  Left TM dull. No erythema, no exudate.  Right canal is completely obstructed with moderate tenderness from speculum insertion. Unable to visualize the ear canal. Ear lobes are tender to manipulation. No surrounding lymphadenopathy. No evidence of mastoiditis   Eyes: Conjunctivae and EOM are normal.  Neck: Neck supple. No tracheal deviation present.  Cardiovascular: Normal rate.   Pulmonary/Chest: Effort normal. No respiratory distress.  Musculoskeletal: Normal range  of motion.  Neurological: She is alert and oriented to person, place, and time.  Skin: Skin is warm and dry.  Psychiatric: She has a normal mood and affect. Her behavior is normal.    ED Course  Procedures (including critical care time)  DIAGNOSTIC STUDIES: Oxygen Saturation is 96% on RA, normal, by my interpretation.    COORDINATION OF CARE: 3:05 PM-Discussed treatment plan which includes precribed medication and ear wick to aid with administering medication. Advised to see an ENT specialist if  improvement is not made in 3 days.Advised to stop using Q-tips and to not swim. Pt agreed to plan.   Labs Review Labs Reviewed - No data to display  Imaging Review No results found.   EKG Interpretation None      MDM   Final diagnoses:  Otitis externa of right ear    BP 167/92  Pulse 84  Temp(Src) 99.2 F (37.3 C) (Oral)  Resp 16  SpO2 96%  I personally performed the services described in this documentation, which was scribed in my presence. The recorded information has been reviewed and is accurate.      Fayrene HelperBowie Tran, PA-C 02/19/14 1552

## 2014-02-19 NOTE — ED Notes (Signed)
PA at bedside. Exam in progress

## 2014-02-19 NOTE — Progress Notes (Signed)
[facility] provided pt with a [facility] Orange Card application to help patient establish primary care.  °

## 2014-02-20 NOTE — ED Provider Notes (Signed)
Medical screening examination/treatment/procedure(s) were performed by non-physician practitioner and as supervising physician I was immediately available for consultation/collaboration.   EKG Interpretation None        Layla MawKristen N Sallee Hogrefe, DO 02/20/14 0710

## 2014-12-02 ENCOUNTER — Encounter (HOSPITAL_COMMUNITY): Payer: Self-pay | Admitting: *Deleted

## 2014-12-02 ENCOUNTER — Inpatient Hospital Stay (HOSPITAL_COMMUNITY)
Admission: EM | Admit: 2014-12-02 | Discharge: 2014-12-09 | DRG: 871 | Disposition: A | Discharge status: Home / Self Care | Payer: Self-pay | Attending: Internal Medicine | Admitting: Internal Medicine

## 2014-12-02 ENCOUNTER — Emergency Department (HOSPITAL_COMMUNITY): Payer: Self-pay

## 2014-12-02 DIAGNOSIS — J80 Acute respiratory distress syndrome: Secondary | ICD-10-CM

## 2014-12-02 DIAGNOSIS — R519 Headache, unspecified: Secondary | ICD-10-CM

## 2014-12-02 DIAGNOSIS — E876 Hypokalemia: Secondary | ICD-10-CM | POA: Diagnosis present

## 2014-12-02 DIAGNOSIS — J1000 Influenza due to other identified influenza virus with unspecified type of pneumonia: Secondary | ICD-10-CM | POA: Diagnosis present

## 2014-12-02 DIAGNOSIS — K5900 Constipation, unspecified: Secondary | ICD-10-CM | POA: Diagnosis present

## 2014-12-02 DIAGNOSIS — R05 Cough: Secondary | ICD-10-CM

## 2014-12-02 DIAGNOSIS — R51 Headache: Secondary | ICD-10-CM

## 2014-12-02 DIAGNOSIS — E669 Obesity, unspecified: Secondary | ICD-10-CM | POA: Diagnosis present

## 2014-12-02 DIAGNOSIS — J9601 Acute respiratory failure with hypoxia: Secondary | ICD-10-CM | POA: Diagnosis present

## 2014-12-02 DIAGNOSIS — A419 Sepsis, unspecified organism: Principal | ICD-10-CM | POA: Diagnosis present

## 2014-12-02 DIAGNOSIS — R059 Cough, unspecified: Secondary | ICD-10-CM | POA: Diagnosis present

## 2014-12-02 DIAGNOSIS — R0602 Shortness of breath: Secondary | ICD-10-CM

## 2014-12-02 DIAGNOSIS — E860 Dehydration: Secondary | ICD-10-CM | POA: Diagnosis present

## 2014-12-02 DIAGNOSIS — R509 Fever, unspecified: Secondary | ICD-10-CM

## 2014-12-02 DIAGNOSIS — T501X5A Adverse effect of loop [high-ceiling] diuretics, initial encounter: Secondary | ICD-10-CM | POA: Diagnosis present

## 2014-12-02 DIAGNOSIS — R740 Nonspecific elevation of levels of transaminase and lactic acid dehydrogenase [LDH]: Secondary | ICD-10-CM | POA: Diagnosis present

## 2014-12-02 DIAGNOSIS — R0600 Dyspnea, unspecified: Secondary | ICD-10-CM

## 2014-12-02 DIAGNOSIS — J111 Influenza due to unidentified influenza virus with other respiratory manifestations: Secondary | ICD-10-CM

## 2014-12-02 DIAGNOSIS — Z9981 Dependence on supplemental oxygen: Secondary | ICD-10-CM

## 2014-12-02 DIAGNOSIS — J101 Influenza due to other identified influenza virus with other respiratory manifestations: Secondary | ICD-10-CM

## 2014-12-02 DIAGNOSIS — A084 Viral intestinal infection, unspecified: Secondary | ICD-10-CM | POA: Diagnosis present

## 2014-12-02 LAB — CBC WITH DIFFERENTIAL/PLATELET
BASOS PCT: 0 % (ref 0–1)
Basophils Absolute: 0 10*3/uL (ref 0.0–0.1)
EOS ABS: 0 10*3/uL (ref 0.0–0.7)
Eosinophils Relative: 0 % (ref 0–5)
HEMATOCRIT: 39.8 % (ref 36.0–46.0)
HEMOGLOBIN: 13.1 g/dL (ref 12.0–15.0)
LYMPHS ABS: 1.1 10*3/uL (ref 0.7–4.0)
Lymphocytes Relative: 13 % (ref 12–46)
MCH: 30.5 pg (ref 26.0–34.0)
MCHC: 32.9 g/dL (ref 30.0–36.0)
MCV: 92.8 fL (ref 78.0–100.0)
MONO ABS: 0.7 10*3/uL (ref 0.1–1.0)
Monocytes Relative: 8 % (ref 3–12)
Neutro Abs: 6.6 10*3/uL (ref 1.7–7.7)
Neutrophils Relative %: 79 % — ABNORMAL HIGH (ref 43–77)
Platelets: 274 10*3/uL (ref 150–400)
RBC: 4.29 MIL/uL (ref 3.87–5.11)
RDW: 13.6 % (ref 11.5–15.5)
WBC: 8.4 10*3/uL (ref 4.0–10.5)

## 2014-12-02 LAB — COMPREHENSIVE METABOLIC PANEL
ALBUMIN: 3.6 g/dL (ref 3.5–5.2)
ALT: 33 U/L (ref 0–35)
ANION GAP: 11 (ref 5–15)
AST: 30 U/L (ref 0–37)
Alkaline Phosphatase: 103 U/L (ref 39–117)
BUN: 13 mg/dL (ref 6–23)
CALCIUM: 9 mg/dL (ref 8.4–10.5)
CO2: 24 mmol/L (ref 19–32)
CREATININE: 0.91 mg/dL (ref 0.50–1.10)
Chloride: 103 mmol/L (ref 96–112)
GFR calc non Af Amer: 71 mL/min — ABNORMAL LOW (ref >90)
GFR, EST AFRICAN AMERICAN: 83 mL/min — AB (ref >90)
Glucose, Bld: 99 mg/dL (ref 70–99)
Potassium: 3.7 mmol/L (ref 3.5–5.1)
SODIUM: 138 mmol/L (ref 135–145)
TOTAL PROTEIN: 7.2 g/dL (ref 6.0–8.3)
Total Bilirubin: 0.3 mg/dL (ref 0.3–1.2)

## 2014-12-02 LAB — URINALYSIS, ROUTINE W REFLEX MICROSCOPIC
GLUCOSE, UA: NEGATIVE mg/dL
Ketones, ur: NEGATIVE mg/dL
LEUKOCYTES UA: NEGATIVE
NITRITE: NEGATIVE
PH: 5 (ref 5.0–8.0)
Protein, ur: 30 mg/dL — AB
SPECIFIC GRAVITY, URINE: 1.031 — AB (ref 1.005–1.030)
Urobilinogen, UA: 0.2 mg/dL (ref 0.0–1.0)

## 2014-12-02 LAB — URINE MICROSCOPIC-ADD ON

## 2014-12-02 LAB — I-STAT CG4 LACTIC ACID, ED
LACTIC ACID, VENOUS: 1.69 mmol/L (ref 0.5–2.0)
LACTIC ACID, VENOUS: 0.62 mmol/L (ref 0.5–2.0)

## 2014-12-02 LAB — LIPASE, BLOOD: Lipase: 20 U/L (ref 11–59)

## 2014-12-02 MED ORDER — ONDANSETRON HCL 4 MG/2ML IJ SOLN: 4.0000 mg | Freq: Three times a day (TID) | INTRAMUSCULAR | Status: DC | PRN

## 2014-12-02 MED ORDER — FENTANYL CITRATE 0.05 MG/ML IJ SOLN
50.0000 ug | Freq: Once | INTRAMUSCULAR | Status: AC
  Administered 2014-12-02: 50 ug via INTRAVENOUS
  Filled 2014-12-02: qty 2

## 2014-12-02 MED ORDER — ENOXAPARIN SODIUM 40 MG/0.4ML ~~LOC~~ SOLN
40.0000 mg | SUBCUTANEOUS | Status: DC
  Administered 2014-12-03 – 2014-12-09 (×7): 40 mg via SUBCUTANEOUS
  Filled 2014-12-02 (×7): qty 0.4

## 2014-12-02 MED ORDER — ACETAMINOPHEN 325 MG PO TABS
650.0000 mg | ORAL_TABLET | Freq: Four times a day (QID) | ORAL | Status: DC | PRN
  Administered 2014-12-03 – 2014-12-05 (×4): 650 mg via ORAL
  Filled 2014-12-02 (×6): qty 2

## 2014-12-02 MED ORDER — SODIUM CHLORIDE 0.9 % IV BOLUS (SEPSIS)
1000.0000 mL | Freq: Once | INTRAVENOUS | Status: AC
  Administered 2014-12-02: 1000 mL via INTRAVENOUS

## 2014-12-02 MED ORDER — ONDANSETRON HCL 4 MG/2ML IJ SOLN
4.0000 mg | Freq: Four times a day (QID) | INTRAMUSCULAR | Status: DC | PRN
  Administered 2014-12-03 (×2): 4 mg via INTRAVENOUS
  Filled 2014-12-02 (×2): qty 2

## 2014-12-02 MED ORDER — ASPIRIN-ACETAMINOPHEN-CAFFEINE 250-250-65 MG PO TABS
1.0000 | ORAL_TABLET | Freq: Four times a day (QID) | ORAL | Status: DC | PRN
  Administered 2014-12-03 – 2014-12-05 (×7): 1 via ORAL
  Filled 2014-12-02 (×12): qty 1

## 2014-12-02 MED ORDER — ONDANSETRON HCL 4 MG/2ML IJ SOLN
4.0000 mg | Freq: Once | INTRAMUSCULAR | Status: AC
  Administered 2014-12-02: 4 mg via INTRAVENOUS
  Filled 2014-12-02: qty 2

## 2014-12-02 MED ORDER — ACETAMINOPHEN 325 MG PO TABS
650.0000 mg | ORAL_TABLET | Freq: Four times a day (QID) | ORAL | Status: DC | PRN
  Administered 2014-12-02: 650 mg via ORAL
  Filled 2014-12-02: qty 2

## 2014-12-02 MED ORDER — SODIUM CHLORIDE 0.9 % IV SOLN
INTRAVENOUS | Status: DC
  Administered 2014-12-02: 23:00:00 via INTRAVENOUS

## 2014-12-02 MED ORDER — SODIUM CHLORIDE 0.9 % IV SOLN
INTRAVENOUS | Status: DC
  Administered 2014-12-04 – 2014-12-05 (×2): via INTRAVENOUS

## 2014-12-02 MED ORDER — ACETAMINOPHEN 650 MG RE SUPP
650.0000 mg | Freq: Four times a day (QID) | RECTAL | Status: DC | PRN
  Administered 2014-12-03: 650 mg via RECTAL
  Filled 2014-12-02: qty 1

## 2014-12-02 MED ORDER — IBUPROFEN 400 MG PO TABS
600.0000 mg | ORAL_TABLET | Freq: Once | ORAL | Status: AC
  Administered 2014-12-02: 600 mg via ORAL
  Filled 2014-12-02 (×2): qty 1

## 2014-12-02 MED ORDER — ONDANSETRON HCL 4 MG PO TABS: 4.0000 mg | ORAL_TABLET | Freq: Four times a day (QID) | ORAL | Status: DC | PRN

## 2014-12-02 NOTE — ED Notes (Addendum)
Pt c/o headache and nausea for three days. Pt reports no relief from OTC medications.

## 2014-12-02 NOTE — ED Notes (Signed)
Pt SpO2 80% on RA prior to medications.  Pt denies any lung issues.  Placed on 2L nasal cannula.  Notified Dr Radford PaxBeaton.

## 2014-12-02 NOTE — Progress Notes (Signed)
New Admission Note:  Pt transferred to the unit form the ED  Arrival Method: Stretcher Mental Orientation: Alert and oriented, spanish speaking, some english Telemetry: Assessment: Completed Skin: Intact, Reddened warm area to R foot. Pt reports it was the sight of a spider bite about 1 month ago IV: NS at 125 Pain: c/o headache off and on Tubes: None Safety Measures: Safety Fall Prevention Plan has been given, discussed and signed Admission: Call to interpreter services for translation and explanation. Pt reports she has no questions at this time. Admitting MD notified [address] Orientation: Patient has been orientated to the room, unit and staff.  Family: None present  Will continue to monitor the patient. Call light has been placed within reach and bed alarm has been activated.   Burley SaverKami Dracen Reigle, RN-BC Phone: [phone]

## 2014-12-02 NOTE — ED Provider Notes (Signed)
CSN: [id number]     Arrival date & time 12/02/14  1642 History   First MD Initiated Contact with Patient 12/02/14 1811     Chief Complaint  Patient presents with  . Headache  . Nausea  . Fever      HPI Patient presents with history of fever and headache for last 2-3 days has been accompanied by nausea vomiting and diarrhea.  Patient's husband had similar symptoms 2 days ago.  Patient denies any lung abnormalities or coughing.  Patient has had no recent travel. History reviewed. No pertinent past medical history. Past Surgical History  Procedure Laterality Date  . Shoulder surgery     History reviewed. No pertinent family history. History  Substance Use Topics  . Smoking status: Never Smoker   . Smokeless tobacco: Not on file  . Alcohol Use: No   OB History    No data available     Review of Systems  Unable to perform ROS All other systems reviewed and are negative.     Allergies  Review of patient's allergies indicates no known allergies.  Home Medications   Prior to Admission medications   Medication Sig Start Date End Date Taking? Authorizing Provider  acetaminophen (TYLENOL) 500 MG tablet Take 500 mg by mouth every 6 (six) hours as needed.   Yes Historical Provider, MD  naproxen (NAPROSYN) 500 MG tablet Take 500 mg by mouth 2 (two) times daily with a meal.   Yes Historical Provider, MD   BP 114/54 mmHg  Pulse 92  Temp(Src) 102.2 F (39 C) (Oral)  Resp 16  Ht 5' (1.524 m)  Wt 200 lb (90.719 kg)  BMI 39.06 kg/m2  SpO2 96% Physical Exam  Constitutional: She is oriented to person, place, and time. She appears well-developed and well-nourished. No distress.  HENT:  Head: Normocephalic and atraumatic.  Eyes: Pupils are equal, round, and reactive to light.  Neck: Normal range of motion. Neck supple. No Brudzinski's sign and no Kernig's sign noted.  Cardiovascular: Normal rate and intact distal pulses.   Pulmonary/Chest: No respiratory distress.  Abdominal:  Normal appearance. She exhibits no distension.  Musculoskeletal: Normal range of motion.  Neurological: She is alert and oriented to person, place, and time. No cranial nerve deficit.  Skin: Skin is warm and dry. No rash noted.  Psychiatric: She has a normal mood and affect. Her behavior is normal.  Nursing note and vitals reviewed.  Physical Exam  Nursing note and vitals reviewed. Constitutional: She is oriented to person, place, and time. She appears well-developed and well-nourished. No distress.  HENT:  Head: Normocephalic and atraumatic.  Eyes: Pupils are equal, round, and reactive to light.  Neck: Normal range of motion.  supple no meningeal signs Kernig's and Brudzinski sign are negative Cardiovascular: Normal rate and intact distal pulses.   Pulmonary/Chest: No respiratory distress.  Abdominal: Normal appearance. She exhibits no distension.  Musculoskeletal: Normal range of motion.  Neurological: She is alert and oriented to person, place, and time. No cranial nerve deficit.  Skin: Skin is warm and dry. No rash noted.  Psychiatric: She has a normal mood and affect. Her behavior is normal.   ED Course  Procedures (including critical care time) Medications  acetaminophen (TYLENOL) tablet 650 mg (650 mg Oral Given 12/02/14 1659)  fentaNYL (SUBLIMAZE) injection 50 mcg (not administered)  0.9 %  sodium chloride infusion (not administered)  sodium chloride 0.9 % bolus 1,000 mL (0 mLs Intravenous Stopped 12/02/14 2017)  fentaNYL (SUBLIMAZE) injection  50 mcg (50 mcg Intravenous Given 12/02/14 1842)  ondansetron (ZOFRAN) injection 4 mg (4 mg Intravenous Given 12/02/14 1840)  ibuprofen (ADVIL,MOTRIN) tablet 600 mg (600 mg Oral Given 12/02/14 2056)    Labs Review Labs Reviewed  CBC WITH DIFFERENTIAL/PLATELET - Abnormal; Notable for the following:    Neutrophils Relative % 79 (*)    All other components within normal limits  COMPREHENSIVE METABOLIC PANEL - Abnormal; Notable for the  following:    GFR calc non Af Amer 71 (*)    GFR calc Af Amer 83 (*)    All other components within normal limits  URINALYSIS, ROUTINE W REFLEX MICROSCOPIC - Abnormal; Notable for the following:    Color, Urine AMBER (*)    APPearance TURBID (*)    Specific Gravity, Urine 1.031 (*)    Hgb urine dipstick LARGE (*)    Bilirubin Urine SMALL (*)    Protein, ur 30 (*)    All other components within normal limits  URINE MICROSCOPIC-ADD ON - Abnormal; Notable for the following:    Bacteria, UA MANY (*)    All other components within normal limits  URINE CULTURE  CLOSTRIDIUM DIFFICILE BY PCR  LIPASE, BLOOD  DIFFERENTIAL  GI PATHOGEN PANEL BY PCR, STOOL  I-STAT CG4 LACTIC ACID, ED  I-STAT CG4 LACTIC ACID, ED    Imaging Review Dg Chest 2 View  12/02/2014   CLINICAL DATA:  Fever. Short of breath. Cough for 3 days. Hypertension.  EXAM: CHEST  2 VIEW  COMPARISON:  None.  FINDINGS: Low volume chest. Low volume produces crowding of the pulmonary vasculature. Basilar atelectasis. No gross focal consolidation. The cardiopericardial silhouette appears within normal limits.  IMPRESSION: Low volume chest.   Electronically Signed   By: Andreas NewportGeoffrey  Lamke M.D.   On: 12/02/2014 19:53     Patient continued with diarrhea and nausea and vomiting.  Plan on admitting for IV hydration and symptomatic treatment. MDM   Final diagnoses:  Fever  Viral gastroenteritis        Nelva Nayobert Tida Saner, MD 12/02/14 2155

## 2014-12-03 DIAGNOSIS — J1189 Influenza due to unidentified influenza virus with other manifestations: Secondary | ICD-10-CM

## 2014-12-03 DIAGNOSIS — J101 Influenza due to other identified influenza virus with other respiratory manifestations: Secondary | ICD-10-CM

## 2014-12-03 DIAGNOSIS — J111 Influenza due to unidentified influenza virus with other respiratory manifestations: Secondary | ICD-10-CM

## 2014-12-03 DIAGNOSIS — R05 Cough: Secondary | ICD-10-CM | POA: Diagnosis present

## 2014-12-03 DIAGNOSIS — R059 Cough, unspecified: Secondary | ICD-10-CM | POA: Diagnosis present

## 2014-12-03 LAB — CBC
HCT: 35.8 % — ABNORMAL LOW (ref 36.0–46.0)
Hemoglobin: 11.6 g/dL — ABNORMAL LOW (ref 12.0–15.0)
MCH: 30.4 pg (ref 26.0–34.0)
MCHC: 32.4 g/dL (ref 30.0–36.0)
MCV: 94 fL (ref 78.0–100.0)
PLATELETS: 234 10*3/uL (ref 150–400)
RBC: 3.81 MIL/uL — AB (ref 3.87–5.11)
RDW: 13.9 % (ref 11.5–15.5)
WBC: 6.8 10*3/uL (ref 4.0–10.5)

## 2014-12-03 LAB — COMPREHENSIVE METABOLIC PANEL
ALBUMIN: 3 g/dL — AB (ref 3.5–5.2)
ALT: 31 U/L (ref 0–35)
ANION GAP: 3 — AB (ref 5–15)
AST: 31 U/L (ref 0–37)
Alkaline Phosphatase: 86 U/L (ref 39–117)
BILIRUBIN TOTAL: 0.2 mg/dL — AB (ref 0.3–1.2)
BUN: 13 mg/dL (ref 6–23)
CALCIUM: 7.9 mg/dL — AB (ref 8.4–10.5)
CHLORIDE: 107 mmol/L (ref 96–112)
CO2: 28 mmol/L (ref 19–32)
Creatinine, Ser: 0.76 mg/dL (ref 0.50–1.10)
GFR calc Af Amer: >90 mL/min (ref >90)
GFR calc non Af Amer: >90 mL/min (ref >90)
Glucose, Bld: 111 mg/dL — ABNORMAL HIGH (ref 70–99)
Potassium: 3.6 mmol/L (ref 3.5–5.1)
Sodium: 138 mmol/L (ref 135–145)
Total Protein: 6 g/dL (ref 6.0–8.3)

## 2014-12-03 LAB — INFLUENZA PANEL BY PCR (TYPE A & B)
H1N1 flu by pcr: DETECTED — AB
INFLAPCR: POSITIVE — AB
INFLBPCR: NEGATIVE

## 2014-12-03 LAB — PROTIME-INR
INR: 1.09 (ref 0.00–1.49)
Prothrombin Time: 14.2 seconds (ref 11.6–15.2)

## 2014-12-03 MED ORDER — SODIUM CHLORIDE 0.9 % IV BOLUS (SEPSIS)
1000.0000 mL | Freq: Once | INTRAVENOUS | Status: AC
  Administered 2014-12-03: 1000 mL via INTRAVENOUS

## 2014-12-03 MED ORDER — KETOROLAC TROMETHAMINE 15 MG/ML IJ SOLN
15.0000 mg | Freq: Four times a day (QID) | INTRAMUSCULAR | Status: AC | PRN
  Administered 2014-12-03 – 2014-12-05 (×7): 15 mg via INTRAVENOUS
  Filled 2014-12-03 (×7): qty 1

## 2014-12-03 MED ORDER — DIPHENHYDRAMINE HCL 50 MG/ML IJ SOLN
25.0000 mg | Freq: Once | INTRAMUSCULAR | Status: AC
  Administered 2014-12-03: 25 mg via INTRAVENOUS
  Filled 2014-12-03: qty 1

## 2014-12-03 MED ORDER — OSELTAMIVIR PHOSPHATE 75 MG PO CAPS
75.0000 mg | ORAL_CAPSULE | Freq: Two times a day (BID) | ORAL | Status: AC
  Administered 2014-12-03 – 2014-12-07 (×10): 75 mg via ORAL
  Filled 2014-12-03 (×11): qty 1

## 2014-12-03 MED ORDER — METOCLOPRAMIDE HCL 5 MG/ML IJ SOLN
5.0000 mg | Freq: Once | INTRAMUSCULAR | Status: AC
  Administered 2014-12-03: 5 mg via INTRAVENOUS
  Filled 2014-12-03: qty 2

## 2014-12-03 NOTE — Progress Notes (Signed)
UR completed 

## 2014-12-03 NOTE — H&P (Signed)
[facility] History and Physical  Patient: Colleen Jacobson  MRN: [id number]  DOB: 1961/05/03  DOS: the patient was seen and examined on 12/02/2014 PCP: No PCP Per Patient  Chief Complaint: Nausea vomiting and diarrhea  HPI: Colleen Jacobson is a 54 y.o. female with no significant Past medical history. The patient is presenting with nausea vomiting diarrhea as well as cough. The symptoms have been ongoing for last 3 days. She also has some headache. She was complaining of dizziness and lightheadedness today and And felt that she is going on nearly pass out. She will miss of fever and chills as well as home. No chest pain no abdominal pain. No blood or no black color bowel movement. No swelling of her legs. Her husband is also having similar symptoms.  The patient is coming from home. And at her baseline independent for most of her ADL.  Review of Systems: as mentioned in the history of present illness.  A Comprehensive review of the other systems is negative.  History reviewed. No pertinent past medical history. Past Surgical History  Procedure Laterality Date  . Shoulder surgery     Social History:  reports that she has never smoked. She does not have any smokeless tobacco history on file. She reports that she does not drink alcohol or use illicit drugs.  No Known Allergies  History reviewed. No pertinent family history.  Prior to Admission medications   Medication Sig Start Date End Date Taking? Authorizing Provider  acetaminophen (TYLENOL) 500 MG tablet Take 500 mg by mouth every 6 (six) hours as needed.   Yes Historical Provider, MD  naproxen (NAPROSYN) 500 MG tablet Take 500 mg by mouth 2 (two) times daily with a meal.   Yes Historical Provider, MD    Physical Exam: Filed Vitals:   12/02/14 2200 12/02/14 2243 12/02/14 2315 12/03/14 0447  BP: 110/60 104/63 99/49 122/88  Pulse: 96 88 84 99  Temp: 100.2 F (37.9 C)  98.9 F (37.2 C) 98.6 F  (37 C)  TempSrc:    Oral  Resp: Height:      Weight:    98.793 kg (217 lb 12.8 oz)  SpO2: 93% 95% 97% 99%    General: Alert, Awake and Oriented to Time, Place and Person. Appear in mild distress Eyes: PERRL ENT: Oral Mucosa clear dry. Neck: no JVD Cardiovascular: S1 and S2 Present, no Murmur, Peripheral Pulses Present Respiratory: Bilateral Air entry equal and Decreased, Clear to Auscultation, noCrackles, n wheezes Abdomen: Bowel Sound present sluggish, Soft and non tender Skin: no Rash Extremities: Trace Pedal edema, no calf tenderness Neurologic: Grossly no focal neuro deficit.  Labs on Admission:  CBC:  Recent Labs Lab 12/02/14 1715  WBC 8.4  NEUTROABS 6.6  HGB 13.1  HCT 39.8  MCV 92.8  PLT 274    CMP     Component Value Date/Time   NA 138 12/02/2014 1715   K 3.7 12/02/2014 1715   CL 103 12/02/2014 1715   CO2 24 12/02/2014 1715   GLUCOSE 99 12/02/2014 1715   BUN 13 12/02/2014 1715   CREATININE 0.91 12/02/2014 1715   CALCIUM 9.0 12/02/2014 1715   PROT 7.2 12/02/2014 1715   ALBUMIN 3.6 12/02/2014 1715   AST 30 12/02/2014 1715   ALT 33 12/02/2014 1715   ALKPHOS 103 12/02/2014 1715   BILITOT 0.3 12/02/2014 1715   GFRNONAA 71* 12/02/2014 1715   GFRAA 83* 12/02/2014 1715     Recent  Labs Lab 12/02/14 1715  LIPASE 20    No results for input(s): CKTOTAL, CKMB, CKMBINDEX, TROPONINI in the last 168 hours. BNP (last 3 results) No results for input(s): BNP in the last 8760 hours.  ProBNP (last 3 results) No results for input(s): PROBNP in the last 8760 hours.   Radiological Exams on Admission: Dg Chest 2 View  12/02/2014   CLINICAL DATA:  Fever. Short of breath. Cough for 3 days. Hypertension.  EXAM: CHEST  2 VIEW  COMPARISON:  None.  FINDINGS: Low volume chest. Low volume produces crowding of the pulmonary vasculature. Basilar atelectasis. No gross focal consolidation. The cardiopericardial silhouette appears within normal limits.   IMPRESSION: Low volume chest.   Electronically Signed   By: Andreas NewportGeoffrey  Lamke M.D.   On: 12/02/2014 19:53    Assessment/Plan Principal Problem:   Dehydration Active Problems:   Viral gastroenteritis   Cough   1. Dehydration The patient is presenting with complaints of nausea vomiting and diarrhea. She appears clinically dehydrated. She compresses of dizziness and near syncopal event. Her lab work does not suggest any significant dehydration. Next and should be admitted in hospital for observation. We will be given IV fluids. Will rule out C. difficile. Due to cough we will also rule out influenza PCR. Give her IV hydration.  2. Headache. Does not appear to have any focal deficit. Use symptomatic management.  Advance goals of care discussion: Full code   DVT Prophylaxis: subcutaneous Heparin. Nutrition: Regular diet  Disposition: Admitted to observation in med-surge unit.  Author: Lynden OxfordPranav Kinisha Soper, MD [facility] Pager: [phone]  If 7PM-7AM, please contact night-coverage [email or web address] Password [email or web address]

## 2014-12-03 NOTE — Progress Notes (Signed)
[facility] PROGRESS NOTE  Francoise SchaumannFrinee Guevara Joffe ZOX:[id number] DOB: 09-Jan-1961 DOA: 12/02/2014 PCP: No PCP Per Patient  Assessment/Plan: 1. H1N1 Influenza with sepsis 1. Started tamiflu 2. Continue hydration as tolerated 3. Pt's significant other is sick contact who recently recovered from flu himself 4. Cont supportive care and droplet precautions 5. Febrile this AM 6. Blood cultures pending 7. CXR clear, as is UA 2. Dehydration 1. Continue hydration as tolerated 3. N/v  1. Suspect secondary to flu 2. Cont anti-emetics and supportive care 4. Headache 1. No photophobia or phonophobia 2. Will cont on toradol PRN +/- reglan and benadryl 5. Obesity 1. Stable 6. DVT prophylaxis 1. Lovenox subQ  Code Status: Full Family Communication: Pt in room, significant other at bedside (indicate person spoken with, relationship, and if by phone, the number) Disposition Plan: pending   Consultants:    Procedures:    Antibiotics:  Tamiflu 4/1>>>   HPI/Subjective: Complains of frontal headache, nausea  Objective: Filed Vitals:   12/02/14 2315 12/03/14 0447 12/03/14 0900 12/03/14 0948  BP: 99/49 122/88  118/66  Pulse: 84 99  100  Temp: 98.9 F (37.2 C) 98.6 F (37 C) 103.1 F (39.5 C) 98.4 F (36.9 C)  TempSrc:  Oral  Oral  Resp: 17 18  18   Height:      Weight:  98.793 kg (217 lb 12.8 oz)    SpO2: 97% 99%  99%    Intake/Output Summary (Last 24 hours) at 12/03/14 1556 Last data filed at 12/03/14 1037  Gross per 24 hour  Intake    120 ml  Output      0 ml  Net    120 ml   Filed Weights   12/02/14 1653 12/03/14 0447  Weight: 90.719 kg (200 lb) 98.793 kg (217 lb 12.8 oz)    Exam:   General:  Awake, in nad  Cardiovascular: regular, s1, s2  Respiratory: normal resp effort, no wheezing  Abdomen: soft,nondistended  Musculoskeletal: perfused, no clubbing   Data Reviewed: Basic Metabolic Panel:  Recent Labs Lab 12/02/14 1715 12/03/14 0535   NA 138 138  K 3.7 3.6  CL 103 107  CO2 24 28  GLUCOSE 99 111*  BUN 13 13  CREATININE 0.91 0.76  CALCIUM 9.0 7.9*   Liver Function Tests:  Recent Labs Lab 12/02/14 1715 12/03/14 0535  AST 30 31  ALT 33 31  ALKPHOS 103 86  BILITOT 0.3 0.2*  PROT 7.2 6.0  ALBUMIN 3.6 3.0*    Recent Labs Lab 12/02/14 1715  LIPASE 20   No results for input(s): AMMONIA in the last 168 hours. CBC:  Recent Labs Lab 12/02/14 1715 12/03/14 0535  WBC 8.4 6.8  NEUTROABS 6.6  --   HGB 13.1 11.6*  HCT 39.8 35.8*  MCV 92.8 94.0  PLT 274 234   Cardiac Enzymes: No results for input(s): CKTOTAL, CKMB, CKMBINDEX, TROPONINI in the last 168 hours. BNP (last 3 results) No results for input(s): BNP in the last 8760 hours.  ProBNP (last 3 results) No results for input(s): PROBNP in the last 8760 hours.  CBG: No results for input(s): GLUCAP in the last 168 hours.  No results found for this or any previous visit (from the past 240 hour(s)).   Studies: Dg Chest 2 View  12/02/2014   CLINICAL DATA:  Fever. Short of breath. Cough for 3 days. Hypertension.  EXAM: CHEST  2 VIEW  COMPARISON:  None.  FINDINGS: Low volume chest. Low volume produces crowding of  the pulmonary vasculature. Basilar atelectasis. No gross focal consolidation. The cardiopericardial silhouette appears within normal limits.  IMPRESSION: Low volume chest.   Electronically Signed   By: Andreas Newport M.D.   On: 12/02/2014 19:53    Scheduled Meds: . enoxaparin (LOVENOX) injection  40 mg Subcutaneous Q24H  . oseltamivir  75 mg Oral BID   Continuous Infusions: . sodium chloride 100 mL/hr at 12/03/14 0030    Principal Problem:   H1N1 influenza Active Problems:   Viral gastroenteritis   Dehydration   Cough   Influenza with respiratory manifestations   CHIU, STEPHEN K  [facility] Pager [phone]. If 7PM-7AM, please contact night-coverage at [email or web address], password [facility] 12/03/2014, 3:56 PM

## 2014-12-04 ENCOUNTER — Encounter (HOSPITAL_COMMUNITY): Payer: Self-pay | Admitting: Radiology

## 2014-12-04 ENCOUNTER — Inpatient Hospital Stay (HOSPITAL_COMMUNITY): Payer: Self-pay

## 2014-12-04 DIAGNOSIS — R519 Headache, unspecified: Secondary | ICD-10-CM

## 2014-12-04 DIAGNOSIS — R51 Headache: Secondary | ICD-10-CM

## 2014-12-04 LAB — BASIC METABOLIC PANEL
Anion gap: 3 — ABNORMAL LOW (ref 5–15)
BUN: 9 mg/dL (ref 6–23)
CO2: 23 mmol/L (ref 19–32)
Calcium: 7.5 mg/dL — ABNORMAL LOW (ref 8.4–10.5)
Chloride: 113 mmol/L — ABNORMAL HIGH (ref 96–112)
Creatinine, Ser: 0.68 mg/dL (ref 0.50–1.10)
GFR calc Af Amer: >90 mL/min (ref >90)
GFR calc non Af Amer: >90 mL/min (ref >90)
Glucose, Bld: 108 mg/dL — ABNORMAL HIGH (ref 70–99)
POTASSIUM: 3.6 mmol/L (ref 3.5–5.1)
SODIUM: 139 mmol/L (ref 135–145)

## 2014-12-04 LAB — URINE CULTURE
CULTURE: NO GROWTH
Colony Count: NO GROWTH

## 2014-12-04 LAB — CBC
HEMATOCRIT: 33.6 % — AB (ref 36.0–46.0)
Hemoglobin: 10.9 g/dL — ABNORMAL LOW (ref 12.0–15.0)
MCH: 30.4 pg (ref 26.0–34.0)
MCHC: 32.4 g/dL (ref 30.0–36.0)
MCV: 93.6 fL (ref 78.0–100.0)
Platelets: 207 10*3/uL (ref 150–400)
RBC: 3.59 MIL/uL — AB (ref 3.87–5.11)
RDW: 14.1 % (ref 11.5–15.5)
WBC: 6.8 10*3/uL (ref 4.0–10.5)

## 2014-12-04 LAB — CLOSTRIDIUM DIFFICILE BY PCR: CDIFFPCR: NEGATIVE

## 2014-12-04 MED ORDER — ALBUTEROL SULFATE (2.5 MG/3ML) 0.083% IN NEBU
2.5000 mg | INHALATION_SOLUTION | RESPIRATORY_TRACT | Status: DC | PRN
  Administered 2014-12-04 – 2014-12-05 (×3): 2.5 mg via RESPIRATORY_TRACT
  Filled 2014-12-04 (×3): qty 3

## 2014-12-04 MED ORDER — DIPHENHYDRAMINE HCL 50 MG/ML IJ SOLN
25.0000 mg | Freq: Once | INTRAMUSCULAR | Status: AC
  Administered 2014-12-04: 25 mg via INTRAVENOUS
  Filled 2014-12-04: qty 1

## 2014-12-04 MED ORDER — METOCLOPRAMIDE HCL 5 MG/ML IJ SOLN
5.0000 mg | Freq: Once | INTRAMUSCULAR | Status: AC
  Administered 2014-12-04: 5 mg via INTRAVENOUS
  Filled 2014-12-04: qty 2

## 2014-12-04 MED ORDER — IOHEXOL 300 MG/ML  SOLN
80.0000 mL | Freq: Once | INTRAMUSCULAR | Status: AC | PRN
  Administered 2014-12-04: 80 mL via INTRAVENOUS

## 2014-12-04 MED ORDER — HYDROCOD POLST-CHLORPHEN POLST 10-8 MG/5ML PO LQCR
5.0000 mL | Freq: Two times a day (BID) | ORAL | Status: DC | PRN
  Administered 2014-12-04 – 2014-12-05 (×2): 5 mL via ORAL
  Filled 2014-12-04 (×2): qty 5

## 2014-12-04 NOTE — Progress Notes (Signed)
Utilization Review completed.  

## 2014-12-04 NOTE — Progress Notes (Signed)
[facility] PROGRESS NOTE  Colleen Jacobson ZOX:[id number] DOB: 04-19-1961 DOA: 12/02/2014 PCP: No PCP Per Patient  Assessment/Plan: 1. H1N1 Influenza with sepsis 1. Started tamiflu 2. Continue hydration as tolerated 3. Pt's significant other is sick contact who recently recovered from flu himself 4. Cont supportive care and droplet precautions 5. Continues to be febrile 6. Blood cultures neg x 2 7. CXR clear, as is UA 2. Dehydration 1. Continue hydration as tolerated 2. Normal renal function 3. N/v  1. Suspect secondary to flu 2. Improved 3. Cdiff is neg 4. Cont anti-emetics and supportive care 4. Headache 1. No photophobia or phonophobia 2. Will cont on toradol PRN +/- reglan and benadryl 3. Discussed case with Neurology who agrees with head CT w/ and w/o contrast 5. Obesity 1. Stable 6. DVT prophylaxis 1. Lovenox subQ  Code Status: Full Family Communication: Pt in room, significant other at bedside Disposition Plan: pending  Consultants:    Procedures:    Antibiotics:  Tamiflu 4/1>>>   HPI/Subjective: Complains of frontal headache, nausea  Objective: Filed Vitals:   12/03/14 2148 12/04/14 0445 12/04/14 0907 12/04/14 1000  BP: 97/49 111/58  124/59  Pulse: 81 88  83  Temp: 99.5 F (37.5 C) 100.7 F (38.2 C) 100.5 F (38.1 C) 99.1 F (37.3 C)  TempSrc: Oral Oral  Oral  Resp: Height:      Weight: 100.8 kg (222 lb 3.6 oz)     SpO2: 96% 95%  96%    Intake/Output Summary (Last 24 hours) at 12/04/14 1558 Last data filed at 12/04/14 1017  Gross per 24 hour  Intake   3190 ml  Output    303 ml  Net   2887 ml   Filed Weights   12/02/14 1653 12/03/14 0447 12/03/14 2148  Weight: 90.719 kg (200 lb) 98.793 kg (217 lb 12.8 oz) 100.8 kg (222 lb 3.6 oz)    Exam:   General:  Awake, in nad  Cardiovascular: regular, s1, s2  Respiratory: normal resp effort, no wheezing  Abdomen: soft,nondistended  Musculoskeletal:  perfused, no clubbing   Data Reviewed: Basic Metabolic Panel:  Recent Labs Lab 12/02/14 1715 12/03/14 0535 12/04/14 0402  NA 138 138 139  Jacobson 3.7 3.6 3.6  CL 103 107 113*  CO2 GLUCOSE 99 111* 108*  BUN CREATININE 0.91 0.76 0.68  CALCIUM 9.0 7.9* 7.5*   Liver Function Tests:  Recent Labs Lab 12/02/14 1715 12/03/14 0535  AST 30 31  ALT 33 31  ALKPHOS 103 86  BILITOT 0.3 0.2*  PROT 7.2 6.0  ALBUMIN 3.6 3.0*    Recent Labs Lab 12/02/14 1715  LIPASE 20   No results for input(s): AMMONIA in the last 168 hours. CBC:  Recent Labs Lab 12/02/14 1715 12/03/14 0535 12/04/14 0402  WBC 8.4 6.8 6.8  NEUTROABS 6.6  --   --   HGB 13.1 11.6* 10.9*  HCT 39.8 35.8* 33.6*  MCV 92.8 94.0 93.6  PLT 274 234 207   Cardiac Enzymes: No results for input(s): CKTOTAL, CKMB, CKMBINDEX, TROPONINI in the last 168 hours. BNP (last 3 results) No results for input(s): BNP in the last 8760 hours.  ProBNP (last 3 results) No results for input(s): PROBNP in the last 8760 hours.  CBG: No results for input(s): GLUCAP in the last 168 hours.  Recent Results (from the past 240 hour(s))  Urine culture     Status: None  Collection Time: 12/02/14  5:02 PM  Result Value Ref Range Status   Specimen Description URINE, RANDOM  Final   Special Requests ADDED [id number]  Final   Colony Count NO GROWTH Performed at [facility]   Final   Culture NO GROWTH Performed at [facility]   Final   Report Status 12/04/2014 FINAL  Final  Culture, blood (routine x 2)     Status: None (Preliminary result)   Collection Time: 12/03/14  8:51 AM  Result Value Ref Range Status   Specimen Description BLOOD LEFT ANTECUBITAL  Final   Special Requests BOTTLES DRAWN AEROBIC AND ANAEROBIC 10CC EACH  Final   Culture   Final           BLOOD CULTURE RECEIVED NO GROWTH TO DATE CULTURE WILL BE HELD FOR 5 DAYS BEFORE ISSUING A FINAL NEGATIVE REPORT Performed at [facility]    Report Status PENDING  Incomplete  Culture, blood (routine x 2)     Status: None (Preliminary result)   Collection Time: 12/03/14  8:56 AM  Result Value Ref Range Status   Specimen Description BLOOD RIGHT HAND  Final   Special Requests BOTTLES DRAWN AEROBIC AND ANAEROBIC 5CC EACH  Final   Culture   Final           BLOOD CULTURE RECEIVED NO GROWTH TO DATE CULTURE WILL BE HELD FOR 5 DAYS BEFORE ISSUING A FINAL NEGATIVE REPORT [facility]formed at [facility]    Report Status PENDING  Incomplete  Clostridium Difficile by PCR     Status: None   Collection Time: 12/03/14  8:00 PM  Result Value Ref Range Status   C difficile by pcr NEGATIVE NEGATIVE Final     Studies: Dg Chest 2 View  12/02/2014   CLINICAL DATA:  Fever. Short of breath. Cough for 3 days. Hypertension.  EXAM: CHEST  2 VIEW  COMPARISON:  None.  FINDINGS: Low volume chest. Low volume produces crowding of the pulmonary vasculature. Basilar atelectasis. No gross focal consolidation. The cardiopericardial silhouette appears within normal limits.  IMPRESSION: Low volume chest.   Electronically Signed   By: Andreas NewportGeoffrey  Lamke M.D.   On: 12/02/2014 19:53    Scheduled Meds: . enoxaparin (LOVENOX) injection  40 mg Subcutaneous Q24H  . oseltamivir  75 mg Oral BID   Continuous Infusions: . sodium chloride 100 mL/hr at 12/04/14 0430    Principal Problem:   H1N1 influenza Active Problems:   Viral gastroenteritis   Dehydration   Cough   Influenza with respiratory manifestations   Colleen Jacobson  [facility] Pager [phone]. If 7PM-7AM, please contact night-coverage at [email or web address], password [email or web address] 12/04/2014, 3:58 PM  LOS: 0 days

## 2014-12-04 NOTE — Progress Notes (Addendum)
Pt noted to be short of breath after ambulation to the bathroom. On assessment bilateral wheezing to upper and lower lobes noted. Page to [facility] for notification. New order for albuterol PRN. Dondra SpryMoore, Laquetta Racey Islee, RN

## 2014-12-05 ENCOUNTER — Inpatient Hospital Stay (HOSPITAL_COMMUNITY): Payer: MEDICAID

## 2014-12-05 ENCOUNTER — Inpatient Hospital Stay (HOSPITAL_COMMUNITY): Payer: Self-pay

## 2014-12-05 LAB — BRAIN NATRIURETIC PEPTIDE: B NATRIURETIC PEPTIDE 5: 51 pg/mL (ref 0.0–100.0)

## 2014-12-05 LAB — STREP PNEUMONIAE URINARY ANTIGEN: Strep Pneumo Urinary Antigen: NEGATIVE

## 2014-12-05 MED ORDER — IPRATROPIUM-ALBUTEROL 0.5-2.5 (3) MG/3ML IN SOLN
3.0000 mL | RESPIRATORY_TRACT | Status: DC
  Filled 2014-12-05: qty 3

## 2014-12-05 MED ORDER — FUROSEMIDE 10 MG/ML IJ SOLN
40.0000 mg | Freq: Two times a day (BID) | INTRAMUSCULAR | Status: DC
  Administered 2014-12-05 – 2014-12-06 (×2): 40 mg via INTRAVENOUS
  Filled 2014-12-05 (×4): qty 4

## 2014-12-05 MED ORDER — ALBUTEROL SULFATE (2.5 MG/3ML) 0.083% IN NEBU
2.5000 mg | INHALATION_SOLUTION | RESPIRATORY_TRACT | Status: DC
  Administered 2014-12-05 – 2014-12-07 (×10): 2.5 mg via RESPIRATORY_TRACT
  Filled 2014-12-05 (×10): qty 3

## 2014-12-05 MED ORDER — FUROSEMIDE 10 MG/ML IJ SOLN
40.0000 mg | Freq: Once | INTRAMUSCULAR | Status: AC
  Administered 2014-12-05: 40 mg via INTRAVENOUS
  Filled 2014-12-05: qty 4

## 2014-12-05 MED ORDER — PREDNISONE 50 MG PO TABS: 60.0000 mg | ORAL_TABLET | Freq: Every day | ORAL | Status: DC

## 2014-12-05 MED ORDER — MAGIC MOUTHWASH
10.0000 mL | Freq: Four times a day (QID) | ORAL | Status: DC | PRN
  Administered 2014-12-05: 10 mL via ORAL
  Filled 2014-12-05 (×2): qty 10

## 2014-12-05 MED ORDER — DEXTROSE 5 % IV SOLN
1.0000 g | Freq: Three times a day (TID) | INTRAVENOUS | Status: DC
  Administered 2014-12-05 – 2014-12-09 (×12): 1 g via INTRAVENOUS
  Filled 2014-12-05 (×14): qty 1

## 2014-12-05 MED ORDER — PREDNISONE 50 MG PO TABS
60.0000 mg | ORAL_TABLET | Freq: Every day | ORAL | Status: DC
  Administered 2014-12-05 – 2014-12-09 (×5): 60 mg via ORAL
  Filled 2014-12-05 (×7): qty 1

## 2014-12-05 MED ORDER — VANCOMYCIN HCL IN DEXTROSE 750-5 MG/150ML-% IV SOLN
750.0000 mg | Freq: Two times a day (BID) | INTRAVENOUS | Status: DC
  Administered 2014-12-06 – 2014-12-07 (×4): 750 mg via INTRAVENOUS
  Filled 2014-12-05 (×6): qty 150

## 2014-12-05 MED ORDER — VANCOMYCIN HCL IN DEXTROSE 1-5 GM/200ML-% IV SOLN
1000.0000 mg | Freq: Once | INTRAVENOUS | Status: AC
  Administered 2014-12-05: 1000 mg via INTRAVENOUS
  Filled 2014-12-05: qty 200

## 2014-12-05 MED ORDER — IPRATROPIUM-ALBUTEROL 0.5-2.5 (3) MG/3ML IN SOLN: 3.0000 mL | Freq: Four times a day (QID) | RESPIRATORY_TRACT | Status: DC | PRN

## 2014-12-05 MED ORDER — ALBUTEROL SULFATE (2.5 MG/3ML) 0.083% IN NEBU: 2.5000 mg | INHALATION_SOLUTION | RESPIRATORY_TRACT | Status: DC | PRN

## 2014-12-05 NOTE — Progress Notes (Signed)
ANTIBIOTIC CONSULT NOTE - INITIAL  Pharmacy Consult for Vancomycin Indication: pneumonia  No Known Allergies  Patient Measurements: Height: 5' (152.4 cm) Weight: 222 lb 3.6 oz (100.8 kg) IBW/kg (Calculated) : 45.5 Adjusted Body Weight:    Vital Signs: Temp: 98.7 F (37.1 C) (04/03 1700) Temp Source: Oral (04/03 1700) BP: 120/59 mmHg (04/03 1700) Pulse Rate: 75 (04/03 1700) Intake/Output from previous day: 04/02 0701 - 04/03 0700 In: 360 [P.O.:360] Out: 800 [Urine:800] Intake/Output from this shift: Total I/O In: 240 [P.O.:240] Out: -   Labs:  Recent Labs  12/03/14 0535 12/04/14 0402  WBC 6.8 6.8  HGB 11.6* 10.9*  PLT 234 207  CREATININE 0.76 0.68   Estimated Creatinine Clearance: 87.8 mL/min (by C-G formula based on Cr of 0.68). No results for input(s): VANCOTROUGH, VANCOPEAK, VANCORANDOM, GENTTROUGH, GENTPEAK, GENTRANDOM, TOBRATROUGH, TOBRAPEAK, TOBRARND, AMIKACINPEAK, AMIKACINTROU, AMIKACIN in the last 72 hours.   Microbiology:   Medical History: History reviewed. No pertinent past medical history.  Medications:  Prescriptions prior to admission  Medication Sig Dispense Refill Last Dose  . acetaminophen (TYLENOL) 500 MG tablet Take 500 mg by mouth every 6 (six) hours as needed.   12/01/2014 at Unknown time  . naproxen (NAPROSYN) 500 MG tablet Take 500 mg by mouth 2 (two) times daily with a meal.   12/01/2014 at Unknown time   Assessment: Flu 54 y/o M with the flu continues to have fevers. CXR with worsening B patchy airspace opacities.  ID: Flu plus possibly PNA.Tmax 101.8. Currently afebrile. WBC 6.8. Scr 0.68. Tamiflu #3/5>> Vanco 4/3>> Cefepime 4/3 x8d>>  Goal of Therapy:  Vancomycin trough level 15-20 mcg/ml  Plan:  Cefepime 1g IV q8hr dose ok Vanco 1g IV x 1 then 750mg  IV q12h Vanco trough after 3-5 doses at steady state.  Colleen Jacobson S. Colleen Jacobson, PharmD, BCPS Clinical Staff Pharmacist Pager [phone]  Colleen Jacobson, Colleen Vecchione  Jacobson 12/05/2014,6:24 PM

## 2014-12-05 NOTE — Progress Notes (Signed)
[facility] PROGRESS NOTE  Aamirah Salmi Bice ZOX:[id number] DOB: 05/24/1961 DOA: 12/02/2014 PCP: No PCP Per Patient  Assessment/Plan: 1. H1N1 Influenza with sepsis 1. Started tamiflu 2. Continue hydration as tolerated 3. Pt's significant other is sick contact who recently recovered from flu himself 4. Cont supportive care and droplet precautions 5. Continues to be febrile 6. Blood cultures neg x 2 7. CXR clear, as is UA 2. Hypoxia 1. CXR with findings of worsening B patchy airspace opacities 2. Good urine output with lasix but little change on f/u CXR with concerns for possible ARDS 3. Discussed case with Critical Care. Recommendations for incentive spirometry and continued Tamiflu with diuretics. Also recommendation for q4hr albuterol nebs scheduled. 4. If increased O2 requirements, would transfer to Stepdown 3. Dehydration 1. Resolved 2. Currently with normal renal function 4. N/v  1. Suspect secondary to flu 2. Improved 3. Cdiff is neg 4. Cont anti-emetics PRN and supportive care 5. Headache 1. No photophobia or phonophobia 2. Cont on toradol PRN +/- reglan and benadryl 3. Discussed case with Neurology. Pt neurologically intact, no neck stiffness on my exam 4. Head CT w/ and w/o contrast normal. Doubt meningitis 6. Obesity 1. Stable 7. DVT prophylaxis 1. Lovenox subQ  Code Status: Full Family Communication: Pt in room, family at bedside Disposition Plan: pending  Consultants:    Procedures:    Antibiotics:  Tamiflu 4/1>>>   HPI/Subjective: Complains of frontal headache, nausea  Objective: Filed Vitals:   12/04/14 1000 12/04/14 1616 12/04/14 2028 12/05/14 0414  BP: 124/59 145/62 126/63 109/48  Pulse: 83 85 88 84  Temp: 99.1 F (37.3 C) 98.5 F (36.9 C) 101.9 F (38.8 C) 99.1 F (37.3 C)  TempSrc: Oral Oral Oral Oral  Resp: Height:      Weight:      SpO2: 96% 97% 94% 96%   No intake or output data in the 24 hours  ending 12/05/14 1637 Filed Weights   12/02/14 1653 12/03/14 0447 12/03/14 2148  Weight: 90.719 kg (200 lb) 98.793 kg (217 lb 12.8 oz) 100.8 kg (222 lb 3.6 oz)    Exam:   General:  Awake, in nad  Cardiovascular: regular, s1, s2  Respiratory: normal resp effort, no wheezing  Abdomen: soft,nondistended  Musculoskeletal: perfused, no clubbing   Data Reviewed: Basic Metabolic Panel:  Recent Labs Lab 12/02/14 1715 12/03/14 0535 12/04/14 0402  NA 138 138 139  K 3.7 3.6 3.6  CL 103 107 113*  CO2 GLUCOSE 99 111* 108*  BUN CREATININE 0.91 0.76 0.68  CALCIUM 9.0 7.9* 7.5*   Liver Function Tests:  Recent Labs Lab 12/02/14 1715 12/03/14 0535  AST 30 31  ALT 33 31  ALKPHOS 103 86  BILITOT 0.3 0.2*  PROT 7.2 6.0  ALBUMIN 3.6 3.0*    Recent Labs Lab 12/02/14 1715  LIPASE 20   No results for input(s): AMMONIA in the last 168 hours. CBC:  Recent Labs Lab 12/02/14 1715 12/03/14 0535 12/04/14 0402  WBC 8.4 6.8 6.8  NEUTROABS 6.6  --   --   HGB 13.1 11.6* 10.9*  HCT 39.8 35.8* 33.6*  MCV 92.8 94.0 93.6  PLT 274 234 207   Cardiac Enzymes: No results for input(s): CKTOTAL, CKMB, CKMBINDEX, TROPONINI in the last 168 hours. BNP (last 3 results) No results for input(s): BNP in the last 8760 hours.  ProBNP (last 3 results) No results for input(s): PROBNP in the  last 8760 hours.  CBG: No results for input(s): GLUCAP in the last 168 hours.  Recent Results (from the past 240 hour(s))  Urine culture     Status: None   Collection Time: 12/02/14  5:02 PM  Result Value Ref Range Status   Specimen Description URINE, RANDOM  Final   Special Requests ADDED [id number]  Final   Colony Count NO GROWTH Performed at [facility]   Final   Culture NO GROWTH Performed at [facility]   Final   Report Status 12/04/2014 FINAL  Final  Culture, blood (routine x 2)     Status: None (Preliminary result)   Collection Time: 12/03/14  8:51  AM  Result Value Ref Range Status   Specimen Description BLOOD LEFT ANTECUBITAL  Final   Special Requests BOTTLES DRAWN AEROBIC AND ANAEROBIC 10CC EACH  Final   Culture   Final           BLOOD CULTURE RECEIVED NO GROWTH TO DATE CULTURE WILL BE HELD FOR 5 DAYS BEFORE ISSUING A FINAL NEGATIVE REPORT Performed at [facility]    Report Status PENDING  Incomplete  Culture, blood (routine x 2)     Status: None (Preliminary result)   Collection Time: 12/03/14  8:56 AM  Result Value Ref Range Status   Specimen Description BLOOD RIGHT HAND  Final   Special Requests BOTTLES DRAWN AEROBIC AND ANAEROBIC 5CC EACH  Final   Culture   Final           BLOOD CULTURE RECEIVED NO GROWTH TO DATE CULTURE WILL BE HELD FOR 5 DAYS BEFORE ISSUING A FINAL NEGATIVE REPORT Performed at [facility]    Report Status PENDING  Incomplete  Clostridium Difficile by PCR     Status: None   Collection Time: 12/03/14  8:00 PM  Result Value Ref Range Status   C difficile by pcr NEGATIVE NEGATIVE Final     Studies: Ct Head W Wo Contrast  12/04/2014   CLINICAL DATA:  Nausea, vomiting and headache.  EXAM: CT HEAD WITHOUT AND WITH CONTRAST  TECHNIQUE: Contiguous axial images were obtained from the base of the skull through the vertex without and with intravenous contrast  CONTRAST:  80mL OMNIPAQUE IOHEXOL 300 MG/ML  SOLN  COMPARISON:  None.  FINDINGS: The brain has normal appearance without evidence of malformation, atrophy, old or acute infarction, mass lesion, hemorrhage, hydrocephalus or extra-axial collection. No abnormal enhancement occurs. No calvarial abnormality. No inflammatory sinus disease.  IMPRESSION: Normal examination.   Electronically Signed   By: Paulina Fusi M.D.   On: 12/04/2014 20:05   Dg Chest Port 1 View  12/05/2014   CLINICAL DATA:  Shortness of breath and upper respiratory infection  EXAM: PORTABLE CHEST - 1 VIEW  COMPARISON:  12/05/2014 at 8:13 a.m.  FINDINGS: Patchy multi lobar airspace  opacities are reidentified. Mild enlargement of the cardiomediastinal silhouette is reidentified. Trace if any pleural effusions. No acute osseous abnormality.  IMPRESSION: Stable multi lobar patchy airspace opacities which could reflect pneumonia or other alveolar filling processes such as ARDS.   Electronically Signed   By: Christiana Pellant M.D.   On: 12/05/2014 16:10   Dg Chest Port 1 View  12/05/2014   CLINICAL DATA:  Acute shortness of breath, cough and congestion  EXAM: PORTABLE CHEST - 1 VIEW  COMPARISON:  12/02/2014  FINDINGS: Persistent low lung volumes. Worsening bilateral patchy nodular airspace disease most pronounced in the right lung and in the  left lower lobe concerning for pneumonia. Heart is enlarged which may be related to supine portable technique. No large effusion. No pneumothorax. Trachea midline. No acute osseous finding.  IMPRESSION: Worsening patchy bilateral airspace process concerning for bilateral pneumonia.   Electronically Signed   By: Judie PetitM.  Shick M.D.   On: 12/05/2014 08:35    Scheduled Meds: . enoxaparin (LOVENOX) injection  40 mg Subcutaneous Q24H  . furosemide  40 mg Intravenous BID  . oseltamivir  75 mg Oral BID  . predniSONE  60 mg Oral Q breakfast   Continuous Infusions:    Principal Problem:   H1N1 influenza Active Problems:   Viral gastroenteritis   Dehydration   Cough   Influenza with respiratory manifestations   Headache   CHIU, STEPHEN K  [facility] Pager [phone]. If 7PM-7AM, please contact night-coverage at [email or web address], password [email or web address] 12/05/2014, 4:37 PM  LOS: 1 day

## 2014-12-06 ENCOUNTER — Inpatient Hospital Stay (HOSPITAL_COMMUNITY): Payer: Self-pay

## 2014-12-06 LAB — BLOOD GAS, ARTERIAL
Acid-Base Excess: 0.7 mmol/L (ref 0.0–2.0)
BICARBONATE: 24.8 meq/L — AB (ref 20.0–24.0)
DRAWN BY: 39899
O2 Content: 2 L/min
O2 Saturation: 92.9 %
PATIENT TEMPERATURE: 98.6
PCO2 ART: 39.5 mmHg (ref 35.0–45.0)
TCO2: 26 mmol/L (ref 0–100)
pH, Arterial: 7.414 (ref 7.350–7.450)
pO2, Arterial: 64.3 mmHg — ABNORMAL LOW (ref 80.0–100.0)

## 2014-12-06 LAB — COMPREHENSIVE METABOLIC PANEL
ALT: 67 U/L — AB (ref 0–35)
AST: 49 U/L — AB (ref 0–37)
Albumin: 2.9 g/dL — ABNORMAL LOW (ref 3.5–5.2)
Alkaline Phosphatase: 96 U/L (ref 39–117)
Anion gap: 8 (ref 5–15)
BUN: 9 mg/dL (ref 6–23)
CHLORIDE: 104 mmol/L (ref 96–112)
CO2: 27 mmol/L (ref 19–32)
Calcium: 8.2 mg/dL — ABNORMAL LOW (ref 8.4–10.5)
Creatinine, Ser: 0.61 mg/dL (ref 0.50–1.10)
GFR calc Af Amer: >90 mL/min (ref >90)
GFR calc non Af Amer: >90 mL/min (ref >90)
GLUCOSE: 141 mg/dL — AB (ref 70–99)
POTASSIUM: 3.6 mmol/L (ref 3.5–5.1)
SODIUM: 139 mmol/L (ref 135–145)
TOTAL PROTEIN: 5.9 g/dL — AB (ref 6.0–8.3)
Total Bilirubin: 0.5 mg/dL (ref 0.3–1.2)

## 2014-12-06 LAB — CBC WITH DIFFERENTIAL/PLATELET
Basophils Absolute: 0 10*3/uL (ref 0.0–0.1)
Basophils Relative: 0 % (ref 0–1)
EOS ABS: 0 10*3/uL (ref 0.0–0.7)
EOS PCT: 0 % (ref 0–5)
HCT: 34.3 % — ABNORMAL LOW (ref 36.0–46.0)
Hemoglobin: 11.4 g/dL — ABNORMAL LOW (ref 12.0–15.0)
LYMPHS ABS: 1.2 10*3/uL (ref 0.7–4.0)
Lymphocytes Relative: 24 % (ref 12–46)
MCH: 30.6 pg (ref 26.0–34.0)
MCHC: 33.2 g/dL (ref 30.0–36.0)
MCV: 92 fL (ref 78.0–100.0)
MONOS PCT: 7 % (ref 3–12)
Monocytes Absolute: 0.4 10*3/uL (ref 0.1–1.0)
Neutro Abs: 3.5 10*3/uL (ref 1.7–7.7)
Neutrophils Relative %: 69 % (ref 43–77)
Platelets: 226 10*3/uL (ref 150–400)
RBC: 3.73 MIL/uL — ABNORMAL LOW (ref 3.87–5.11)
RDW: 13.7 % (ref 11.5–15.5)
WBC: 5.2 10*3/uL (ref 4.0–10.5)

## 2014-12-06 LAB — LEGIONELLA ANTIGEN, URINE

## 2014-12-06 LAB — HIV ANTIBODY (ROUTINE TESTING W REFLEX): HIV Screen 4th Generation wRfx: NONREACTIVE

## 2014-12-06 MED ORDER — FUROSEMIDE 10 MG/ML IJ SOLN
40.0000 mg | Freq: Two times a day (BID) | INTRAMUSCULAR | Status: DC
  Administered 2014-12-06 – 2014-12-09 (×6): 40 mg via INTRAVENOUS
  Filled 2014-12-06 (×6): qty 4

## 2014-12-06 MED ORDER — FUROSEMIDE 10 MG/ML IJ SOLN: 40.0000 mg | Freq: Once | INTRAMUSCULAR | Status: DC

## 2014-12-06 NOTE — Progress Notes (Addendum)
[facility] PROGRESS NOTE  Colleen Jacobson ZOX:[id number] DOB: 19-Nov-1960 DOA: 12/02/2014 PCP: No PCP Per Patient  Assessment/Plan: 1. H1N1 Influenza with sepsis 1. Started tamiflu on 4/1 2. Pt's significant other is sick contact who recently recovered from flu himself 3. Cont supportive care and droplet precautions 4. Continues to be intermittently febrile 5. Blood cultures neg x 2 2. Hypoxia from possible HCAP vs early ARDS 1. CXR with findings of worsening B patchy airspace opacities 2. Good urine output with lasix but, initially, little change on f/u CXR with concerns for possible ARDS 3. Discussed case with Critical Care. Recommendations for incentive spirometry and continued Tamiflu with diuretics. Also recommendation for q4hr albuterol nebs scheduled. 4. Pt has since noted feeling improved 5. Continue diuretics for early ARDS and empiric abx for possible HCAP 6. Follow up cxr appears improved 7. BNP is normal so doubt CHF. 2d echo is pending 3. Dehydration 1. Resolved 2. Currently with normal renal function 4. N/v  1. Suspect secondary to flu 2. Improved 3. Cdiff is neg 4. Cont anti-emetics PRN and supportive care 5. Headache 1. No photophobia or phonophobia 2. Cont on toradol PRN +/- reglan and benadryl 3. Discussed case with Neurology. Pt neurologically intact, no neck stiffness on my exam 4. Head CT w/ and w/o contrast normal. Doubt meningitis 6. Obesity 1. Stable 7. DVT prophylaxis 1. Lovenox subQ  Code Status: Full Family Communication: Pt in room, family at bedside Disposition Plan: pending  Consultants:    Procedures:    Antibiotics:  Tamiflu 4/1>>>   HPI/Subjective: Eager to go home. Reports feeling better but still sob  Objective: Filed Vitals:   12/06/14 0617 12/06/14 0751 12/06/14 0809 12/06/14 1546  BP: 103/54 104/50    Pulse: 65 69    Temp: 97.9 F (36.6 C) 98.7 F (37.1 C)    TempSrc: Oral Oral    Resp: 20 17     Height:      Weight:      SpO2: 100% 93% 92% 93%    Intake/Output Summary (Last 24 hours) at 12/06/14 1822 Last data filed at 12/06/14 1744  Gross per 24 hour  Intake   1020 ml  Output   2750 ml  Net  -1730 ml   Filed Weights   12/03/14 0447 12/03/14 2148 12/05/14 2110  Weight: 98.793 kg (217 lb 12.8 oz) 100.8 kg (222 lb 3.6 oz) 100.8 kg (222 lb 3.6 oz)    Exam:   General:  Awake, laying in bed, in nad  Cardiovascular: regular, s1, s2  Respiratory: normal resp effort, no wheezing  Abdomen: soft,nondistended  Musculoskeletal: perfused, no clubbing   Data Reviewed: Basic Metabolic Panel:  Recent Labs Lab 12/02/14 1715 12/03/14 0535 12/04/14 0402 12/06/14 0717  NA 138 138 139 139  K 3.7 3.6 3.6 3.6  CL 103 107 113* 104  CO2 GLUCOSE 99 111* 108* 141*  BUN CREATININE 0.91 0.76 0.68 0.61  CALCIUM 9.0 7.9* 7.5* 8.2*   Liver Function Tests:  Recent Labs Lab 12/02/14 1715 12/03/14 0535 12/06/14 0717  AST 30 31 49*  ALT 33 31 67*  ALKPHOS 103 86 96  BILITOT 0.3 0.2* 0.5  PROT 7.2 6.0 5.9*  ALBUMIN 3.6 3.0* 2.9*    Recent Labs Lab 12/02/14 1715  LIPASE 20   No results for input(s): AMMONIA in the last 168 hours. CBC:  Recent Labs Lab 12/02/14 1715 12/03/14 0535 12/04/14 0402 12/06/14 8119  WBC 8.4 6.8 6.8 5.2  NEUTROABS 6.6  --   --  3.5  HGB 13.1 11.6* 10.9* 11.4*  HCT 39.8 35.8* 33.6* 34.3*  MCV 92.8 94.0 93.6 92.0  PLT 274 234 207 226   Cardiac Enzymes: No results for input(s): CKTOTAL, CKMB, CKMBINDEX, TROPONINI in the last 168 hours. BNP (last 3 results)  Recent Labs  12/05/14 1820  BNP 51.0    ProBNP (last 3 results) No results for input(s): PROBNP in the last 8760 hours.  CBG: No results for input(s): GLUCAP in the last 168 hours.  Recent Results (from the past 240 hour(s))  Urine culture     Status: None   Collection Time: 12/02/14  5:02 PM  Result Value Ref Range Status   Specimen  Description URINE, RANDOM  Final   Special Requests ADDED [id number]  Final   Colony Count NO GROWTH [facility]formed at [facility]   Final   Culture NO GROWTH [facility]formed at [facility]   Final   Report Status 12/04/2014 FINAL  Final  Culture, blood (routine x 2)     Status: None (Preliminary result)   Collection Time: 12/03/14  8:51 AM  Result Value Ref Range Status   Specimen Description BLOOD LEFT ANTECUBITAL  Final   Special Requests BOTTLES DRAWN AEROBIC AND ANAEROBIC 10CC EACH  Final   Culture   Final           BLOOD CULTURE RECEIVED NO GROWTH TO DATE CULTURE WILL BE HELD FOR 5 DAYS BEFORE ISSUING A FINAL NEGATIVE REPORT Performed at [facility]    Report Status PENDING  Incomplete  Culture, blood (routine x 2)     Status: None (Preliminary result)   Collection Time: 12/03/14  8:56 AM  Result Value Ref Range Status   Specimen Description BLOOD RIGHT HAND  Final   Special Requests BOTTLES DRAWN AEROBIC AND ANAEROBIC 5CC EACH  Final   Culture   Final           BLOOD CULTURE RECEIVED NO GROWTH TO DATE CULTURE WILL BE HELD FOR 5 DAYS BEFORE ISSUING A FINAL NEGATIVE REPORT Performed at [facility]    Report Status PENDING  Incomplete  Clostridium Difficile by PCR     Status: None   Collection Time: 12/03/14  8:00 PM  Result Value Ref Range Status   C difficile by pcr NEGATIVE NEGATIVE Final     Studies: Ct Head W Wo Contrast  12/04/2014   CLINICAL DATA:  Nausea, vomiting and headache.  EXAM: CT HEAD WITHOUT AND WITH CONTRAST  TECHNIQUE: Contiguous axial images were obtained from the base of the skull through the vertex without and with intravenous contrast  CONTRAST:  80mL OMNIPAQUE IOHEXOL 300 MG/ML  SOLN  COMPARISON:  None.  FINDINGS: The brain has normal appearance without evidence of malformation, atrophy, old or acute infarction, mass lesion, hemorrhage, hydrocephalus or extra-axial collection. No abnormal enhancement occurs. No calvarial  abnormality. No inflammatory sinus disease.  IMPRESSION: Normal examination.   Electronically Signed   By: Paulina FusiMark  Shogry M.D.   On: 12/04/2014 20:05   Dg Chest Port 1 View  12/06/2014   CLINICAL DATA:  Shortness of breath. Upper respiratory tract infection.  EXAM: PORTABLE CHEST - 1 VIEW  COMPARISON:  None.  FINDINGS: Mediastinum and hilar structures normal. Cardiomegaly is stable. Patchy bilateral pulmonary interstitial infiltrates have partially cleared . Nodular interstitial infiltrates remaining and continued follow-up chest x-rays recommended demonstrate to complete clearing. These changes  could be related to pneumonitis and/or interstitial edema. No pneumothorax. No acute bony abnormality .  IMPRESSION: Persistent but improving bilateral pulmonary interstitial nodular infiltrates. Continued follow-up chest x-rays recommended to demonstrate complete clearing.   Electronically Signed   By: Maisie Fus  Register   On: 12/06/2014 08:53   Dg Chest Port 1 View  12/05/2014   CLINICAL DATA:  Shortness of breath and upper respiratory infection  EXAM: PORTABLE CHEST - 1 VIEW  COMPARISON:  12/05/2014 at 8:13 a.m.  FINDINGS: Patchy multi lobar airspace opacities are reidentified. Mild enlargement of the cardiomediastinal silhouette is reidentified. Trace if any pleural effusions. No acute osseous abnormality.  IMPRESSION: Stable multi lobar patchy airspace opacities which could reflect pneumonia or other alveolar filling processes such as ARDS.   Electronically Signed   By: Christiana Pellant M.D.   On: 12/05/2014 16:10   Dg Chest Port 1 View  12/05/2014   CLINICAL DATA:  Acute shortness of breath, cough and congestion  EXAM: PORTABLE CHEST - 1 VIEW  COMPARISON:  12/02/2014  FINDINGS: Persistent low lung volumes. Worsening bilateral patchy nodular airspace disease most pronounced in the right lung and in the left lower lobe concerning for pneumonia. Heart is enlarged which may be related to supine portable technique. No  large effusion. No pneumothorax. Trachea midline. No acute osseous finding.  IMPRESSION: Worsening patchy bilateral airspace process concerning for bilateral pneumonia.   Electronically Signed   By: Judie Petit.  Shick M.D.   On: 12/05/2014 08:35    Scheduled Meds: . albuterol  2.5 mg Nebulization Q4H  . ceFEPime (MAXIPIME) IV  1 g Intravenous 3 times per day  . enoxaparin (LOVENOX) injection  40 mg Subcutaneous Q24H  . furosemide  40 mg Intravenous BID  . oseltamivir  75 mg Oral BID  . predniSONE  60 mg Oral Q breakfast  . vancomycin  750 mg Intravenous Q12H   Continuous Infusions:   Principal Problem:   H1N1 influenza Active Problems:   Viral gastroenteritis   Dehydration   Cough   Influenza with respiratory manifestations   Headache  Brittnay Pigman K  [facility] Pager [phone]. If 7PM-7AM, please contact night-coverage at [email or web address], password [facility] 12/06/2014, 6:22 PM  LOS: 2 days

## 2014-12-07 ENCOUNTER — Inpatient Hospital Stay (HOSPITAL_COMMUNITY): Payer: Self-pay

## 2014-12-07 DIAGNOSIS — I509 Heart failure, unspecified: Secondary | ICD-10-CM

## 2014-12-07 LAB — GI PATHOGEN PANEL BY PCR, STOOL
C difficile toxin A/B: NOT DETECTED
CRYPTOSPORIDIUM BY PCR: NOT DETECTED
Campylobacter by PCR: NOT DETECTED
E coli (ETEC) LT/ST: NOT DETECTED
E coli (STEC): NOT DETECTED
E coli 0157 by PCR: NOT DETECTED
G lamblia by PCR: NOT DETECTED
NOROVIRUS G1/G2: NOT DETECTED
ROTAVIRUS A BY PCR: NOT DETECTED
SHIGELLA BY PCR: NOT DETECTED
Salmonella by PCR: NOT DETECTED

## 2014-12-07 LAB — BASIC METABOLIC PANEL
Anion gap: 10 (ref 5–15)
BUN: 11 mg/dL (ref 6–23)
CO2: 28 mmol/L (ref 19–32)
Calcium: 8.2 mg/dL — ABNORMAL LOW (ref 8.4–10.5)
Chloride: 99 mmol/L (ref 96–112)
Creatinine, Ser: 0.6 mg/dL (ref 0.50–1.10)
Glucose, Bld: 116 mg/dL — ABNORMAL HIGH (ref 70–99)
POTASSIUM: 3.6 mmol/L (ref 3.5–5.1)
Sodium: 137 mmol/L (ref 135–145)

## 2014-12-07 MED ORDER — ALBUTEROL SULFATE (2.5 MG/3ML) 0.083% IN NEBU
2.5000 mg | INHALATION_SOLUTION | Freq: Four times a day (QID) | RESPIRATORY_TRACT | Status: DC
  Administered 2014-12-07 – 2014-12-08 (×6): 2.5 mg via RESPIRATORY_TRACT
  Filled 2014-12-07 (×7): qty 3

## 2014-12-07 MED ORDER — BISACODYL 10 MG RE SUPP
10.0000 mg | Freq: Every day | RECTAL | Status: DC | PRN
  Administered 2014-12-08: 10 mg via RECTAL
  Filled 2014-12-07: qty 1

## 2014-12-07 MED ORDER — POLYETHYLENE GLYCOL 3350 17 G PO PACK
17.0000 g | PACK | Freq: Every day | ORAL | Status: DC | PRN
  Administered 2014-12-07: 17 g via ORAL
  Filled 2014-12-07 (×3): qty 1

## 2014-12-07 MED ORDER — PERFLUTREN LIPID MICROSPHERE
1.0000 mL | INTRAVENOUS | Status: AC | PRN
  Administered 2014-12-07: 4 mL via INTRAVENOUS
  Filled 2014-12-07: qty 10

## 2014-12-07 NOTE — Progress Notes (Signed)
  Echocardiogram 2D Echocardiogram with Definity has been performed.  Evangelynn Lochridge 12/07/2014, 1:15 PM

## 2014-12-07 NOTE — Progress Notes (Addendum)
[facility] PROGRESS NOTE  Colleen Jacobson ZOX:[id number] DOB: December 16, 1960 DOA: 12/02/2014 PCP: No PCP Per Patient   Off Service Summary: Initially admitted with n/v/d found to be pos for H1N1. Pt started on tamiflu. Noted to have fevers and headache and CT head obtained, which was unremarkable. Headache later resolved. Pt then developed sob, found to have PNA vs ARDS on cxr. Lasix and broad spec abx were started. Pt remains O2 dependent but is improving.  Assessment/Plan: 1. H1N1 Influenza with sepsis 1. Started tamiflu on 4/1 2. Pt's significant other is sick contact who recently recovered from flu himself 3. Cont supportive care and droplet precautions 4. Continues to be intermittently febrile 5. Blood cultures neg x 2 2. Hypoxia from possible HCAP vs early ARDS 1. Recent CXR with findings of worsening B patchy airspace opacities 2. Good urine output with lasix but, initially, little change on f/u CXR with concerns for possible ARDS 3. Discussed case with Critical Care. Recommendations for incentive spirometry and continued Tamiflu with diuretics. Also recommendation for q4hr albuterol nebs scheduled. 4. Pt has since noted feeling somewhat improved 5. Continue diuretics for concerns of early ARDS and empiric abx for possible HCAP 6. Follow up cxr appears improved 7. Remains on 4LNC 8. BNP is normal so doubt CHF. 2d echo is unremarkable with normal EF 3. Dehydration 1. Resolved 2. Currently with normal renal function 4. N/v  1. Suspect secondary to flu 2. Improved 3. Cdiff is neg 4. Cont anti-emetics PRN and supportive care 5. Headache 1. No photophobia or phonophobia 2. Cont on toradol PRN +/- reglan and benadryl 3. Discussed case with Neurology. Pt neurologically intact, no neck stiffness on my exam 4. Head CT w/ and w/o contrast normal. Doubt meningitis 6. Obesity 1. Stable 7. DVT prophylaxis 1. Lovenox subQ  Code Status: Full Family Communication: Pt in  room, family at bedside Disposition Plan: pending  Consultants:    Procedures:    Antibiotics:  Tamiflu 4/1>>>   HPI/Subjective: Still sob on O2. Eager to go home soon.  Objective: Filed Vitals:   12/07/14 0337 12/07/14 0553 12/07/14 0945 12/07/14 1610  BP:  106/52 116/63 119/63  Pulse:  69 77 73  Temp:  97.4 F (36.3 C) 97.7 F (36.5 C) 98 F (36.7 C)  TempSrc:  Axillary Oral Oral  Resp:  Height:      Weight:      SpO2: 94% 100% 95% 98%    Intake/Output Summary (Last 24 hours) at 12/07/14 1644 Last data filed at 12/07/14 1339  Gross per 24 hour  Intake    360 ml  Output   3800 ml  Net  -3440 ml   Filed Weights   12/03/14 0447 12/03/14 2148 12/05/14 2110  Weight: 98.793 kg (217 lb 12.8 oz) 100.8 kg (222 lb 3.6 oz) 100.8 kg (222 lb 3.6 oz)    Exam:   General:  Awake, laying in bed, in nad  Cardiovascular: regular, s1, s2  Respiratory: normal resp effort, no wheezing  Abdomen: soft,nondistended  Musculoskeletal: perfused, no clubbing   Data Reviewed: Basic Metabolic Panel:  Recent Labs Lab 12/02/14 1715 12/03/14 0535 12/04/14 0402 12/06/14 0717 12/07/14 0605  NA 138 138 139 139 137  K 3.7 3.6 3.6 3.6 3.6  CL 103 107 113* 104 99  CO2 GLUCOSE 99 111* 108* 141* 116*  BUN CREATININE 0.91 0.76 0.68 0.61 0.60  CALCIUM 9.0  7.9* 7.5* 8.2* 8.2*   Liver Function Tests:  Recent Labs Lab 12/02/14 1715 12/03/14 0535 12/06/14 0717  AST 30 31 49*  ALT 33 31 67*  ALKPHOS 103 86 96  BILITOT 0.3 0.2* 0.5  PROT 7.2 6.0 5.9*  ALBUMIN 3.6 3.0* 2.9*    Recent Labs Lab 12/02/14 1715  LIPASE 20   No results for input(s): AMMONIA in the last 168 hours. CBC:  Recent Labs Lab 12/02/14 1715 12/03/14 0535 12/04/14 0402 12/06/14 0717  WBC 8.4 6.8 6.8 5.2  NEUTROABS 6.6  --   --  3.5  HGB 13.1 11.6* 10.9* 11.4*  HCT 39.8 35.8* 33.6* 34.3*  MCV 92.8 94.0 93.6 92.0  PLT 274 234 207 226   Cardiac  Enzymes: No results for input(s): CKTOTAL, CKMB, CKMBINDEX, TROPONINI in the last 168 hours. BNP (last 3 results)  Recent Labs  12/05/14 1820  BNP 51.0    ProBNP (last 3 results) No results for input(s): PROBNP in the last 8760 hours.  CBG: No results for input(s): GLUCAP in the last 168 hours.  Recent Results (from the past 240 hour(s))  Urine culture     Status: None   Collection Time: 12/02/14  5:02 PM  Result Value Ref Range Status   Specimen Description URINE, RANDOM  Final   Special Requests ADDED [id number]  Final   Colony Count NO GROWTH Performed at [facility]   Final   Culture NO GROWTH Performed at [facility]   Final   Report Status 12/04/2014 FINAL  Final  Culture, blood (routine x 2)     Status: None (Preliminary result)   Collection Time: 12/03/14  8:51 AM  Result Value Ref Range Status   Specimen Description BLOOD LEFT ANTECUBITAL  Final   Special Requests BOTTLES DRAWN AEROBIC AND ANAEROBIC 10CC EACH  Final   Culture   Final           BLOOD CULTURE RECEIVED NO GROWTH TO DATE CULTURE WILL BE HELD FOR 5 DAYS BEFORE ISSUING A FINAL NEGATIVE REPORT Performed at [facility]    Report Status PENDING  Incomplete  Culture, blood (routine x 2)     Status: None (Preliminary result)   Collection Time: 12/03/14  8:56 AM  Result Value Ref Range Status   Specimen Description BLOOD RIGHT HAND  Final   Special Requests BOTTLES DRAWN AEROBIC AND ANAEROBIC 5CC EACH  Final   Culture   Final           BLOOD CULTURE RECEIVED NO GROWTH TO DATE CULTURE WILL BE HELD FOR 5 DAYS BEFORE ISSUING A FINAL NEGATIVE REPORT Performed at [facility]    Report Status PENDING  Incomplete  Clostridium Difficile by PCR     Status: None   Collection Time: 12/03/14  8:00 PM  Result Value Ref Range Status   C difficile by pcr NEGATIVE NEGATIVE Final     Studies: Dg Chest Port 1 View  12/07/2014   CLINICAL DATA:  Shortness of Breath  EXAM: PORTABLE  CHEST - 1 VIEW  COMPARISON:  12/06/2014  FINDINGS: Cardiac shadow is stable. Persistent bilateral infiltrates are again identified. The overall appearance is stable from the previous day. Elevation of the right hemidiaphragm is again noted.  IMPRESSION: Stable appearance when compared with the previous day.   Electronically Signed   By: Alcide CleverMark  Lukens M.D.   On: 12/07/2014 11:41   Dg Chest Port 1 View  12/06/2014   CLINICAL DATA:  Shortness of breath. Upper respiratory tract infection.  EXAM: PORTABLE CHEST - 1 VIEW  COMPARISON:  None.  FINDINGS: Mediastinum and hilar structures normal. Cardiomegaly is stable. Patchy bilateral pulmonary interstitial infiltrates have partially cleared . Nodular interstitial infiltrates remaining and continued follow-up chest x-rays recommended demonstrate to complete clearing. These changes could be related to pneumonitis and/or interstitial edema. No pneumothorax. No acute bony abnormality .  IMPRESSION: Persistent but improving bilateral pulmonary interstitial nodular infiltrates. Continued follow-up chest x-rays recommended to demonstrate complete clearing.   Electronically Signed   By: Maisie Fus  Register   On: 12/06/2014 08:53    Scheduled Meds: . albuterol  2.5 mg Nebulization Q6H  . ceFEPime (MAXIPIME) IV  1 g Intravenous 3 times per day  . enoxaparin (LOVENOX) injection  40 mg Subcutaneous Q24H  . furosemide  40 mg Intravenous BID  . oseltamivir  75 mg Oral BID  . predniSONE  60 mg Oral Q breakfast  . vancomycin  750 mg Intravenous Q12H   Continuous Infusions:   Principal Problem:   H1N1 influenza Active Problems:   Viral gastroenteritis   Dehydration   Cough   Influenza with respiratory manifestations   Headache  Luka Stohr K  [facility] Pager [phone]. If 7PM-7AM, please contact night-coverage at [email or web address], password [facility] 12/07/2014, 4:44 PM  LOS: 3 days

## 2014-12-08 ENCOUNTER — Inpatient Hospital Stay (HOSPITAL_COMMUNITY): Payer: Self-pay

## 2014-12-08 DIAGNOSIS — J80 Acute respiratory distress syndrome: Secondary | ICD-10-CM

## 2014-12-08 DIAGNOSIS — A419 Sepsis, unspecified organism: Secondary | ICD-10-CM

## 2014-12-08 DIAGNOSIS — J9601 Acute respiratory failure with hypoxia: Secondary | ICD-10-CM

## 2014-12-08 LAB — CBC
HCT: 38.3 % (ref 36.0–46.0)
HEMOGLOBIN: 12.7 g/dL (ref 12.0–15.0)
MCH: 30.7 pg (ref 26.0–34.0)
MCHC: 33.2 g/dL (ref 30.0–36.0)
MCV: 92.5 fL (ref 78.0–100.0)
Platelets: 320 10*3/uL (ref 150–400)
RBC: 4.14 MIL/uL (ref 3.87–5.11)
RDW: 13.7 % (ref 11.5–15.5)
WBC: 9.2 10*3/uL (ref 4.0–10.5)

## 2014-12-08 LAB — BASIC METABOLIC PANEL
Anion gap: 13 (ref 5–15)
BUN: 15 mg/dL (ref 6–23)
CHLORIDE: 96 mmol/L (ref 96–112)
CO2: 31 mmol/L (ref 19–32)
Calcium: 8.7 mg/dL (ref 8.4–10.5)
Creatinine, Ser: 0.64 mg/dL (ref 0.50–1.10)
Glucose, Bld: 98 mg/dL (ref 70–99)
Potassium: 3.3 mmol/L — ABNORMAL LOW (ref 3.5–5.1)
SODIUM: 140 mmol/L (ref 135–145)

## 2014-12-08 LAB — VANCOMYCIN, TROUGH: Vancomycin Tr: 6.1 ug/mL — ABNORMAL LOW (ref 10.0–20.0)

## 2014-12-08 MED ORDER — VANCOMYCIN HCL IN DEXTROSE 1-5 GM/200ML-% IV SOLN
1000.0000 mg | Freq: Three times a day (TID) | INTRAVENOUS | Status: DC
  Administered 2014-12-08 – 2014-12-09 (×4): 1000 mg via INTRAVENOUS
  Filled 2014-12-08 (×6): qty 200

## 2014-12-08 MED ORDER — POTASSIUM CHLORIDE CRYS ER 20 MEQ PO TBCR
40.0000 meq | EXTENDED_RELEASE_TABLET | Freq: Once | ORAL | Status: AC
  Administered 2014-12-08: 40 meq via ORAL
  Filled 2014-12-08: qty 2

## 2014-12-08 NOTE — Progress Notes (Signed)
Patient's PO2 without O2 while ambulating was inbetween 89-95% Patient's PO2 with 2L O2 while ambulating was between 93-97%.

## 2014-12-08 NOTE — Progress Notes (Signed)
[facility] PROGRESS NOTE  Colleen Jacobson ZOX:[id number] DOB: May 19, 1961 DOA: 12/02/2014 PCP: No PCP Per Patient   Off Service Summary: Initially admitted with n/v/d found to be pos for H1N1. Pt started on tamiflu. Noted to have fevers and headache and CT head obtained, which was unremarkable. Headache later resolved. Pt then developed sob, found to have PNA vs ARDS on cxr. Lasix and broad spec abx were started. Pt remains O2 dependent but is improving.  Assessment/Plan: 1. H1N1 Influenza with sepsis 1. Started tamiflu on 4/1 2. Pt's significant other is sick contact who recently recovered from flu himself 3. Cont supportive care and droplet precautions 4. Continues to be intermittently febrile 5. Blood cultures neg x 2 2. Acute hypoxic respiratory failure secondary to influenza pneumonia, possible HCAP vs early ARDS 1. Recent CXR with findings of worsening B patchy airspace opacities 2. Good urine output with lasix but, initially, little change on f/u CXR with concerns for possible ARDS 3. Discussed case with Critical Care. Recommendations for incentive spirometry and continued Tamiflu with diuretics and adding abx for HCAP.  4. Continue q4hr albuterol nebs scheduled. 5. Continue prednisone day 4 of 5 6. Continue broad-spectrum antibiotics day 4 7. Repeat chest x-ray  8. Continue IV Lasix twice a day  9. Decrease to 2 L nasal cannula if possible 10. BNP is normal so doubt CHF. 2d echo is unremarkable with normal EF 3. Dehydration 1. Resolved 2. Currently with normal renal function 4. N/v  1. Suspect secondary to flu 2. Improved 3. Cdiff is neg 4. Cont anti-emetics PRN and supportive care 5. Headache 1. No photophobia or phonophobia 2. Cont on toradol PRN +/- reglan and benadryl 3. Discussed case with Neurology. Pt neurologically intact, no neck stiffness on my exam 4. Head CT w/ and w/o contrast normal. Doubt meningitis 6. Obesity, Stable 7. Constipation 1.   start miralax and prn bisacodyl 8. Hypokalemia due to lasix 1. Oral potassium repletion 9. DVT prophylaxis 1. Lovenox subQ  Code Status: Full Family Communication: Pt in room with phone interpreter Disposition Plan:  Home soon  Consultants:  PCCM by phone  Procedures:  CXR  Antibiotics:  Tamiflu 4/1>>>   vanc 4/3 >>  Cefepime 4/3 >>  HPI/Subjective: Still sob, but improving.  On 3L [place] today.  Cough productive of sputum.  Wheeze has improved.  Objective: Filed Vitals:   12/08/14 0104 12/08/14 0541 12/08/14 0825 12/08/14 0900  BP:  128/58  120/71  Pulse:  83  71  Temp:  98.3 F (36.8 C)  98.1 F (36.7 C)  TempSrc:  Oral  Oral  Resp:  17  18  Height:      Weight:      SpO2: 96% 94% 92% 95%    Intake/Output Summary (Last 24 hours) at 12/08/14 1034 Last data filed at 12/08/14 0935  Gross per 24 hour  Intake    717 ml  Output   3550 ml  Net  -2833 ml   Filed Weights   12/03/14 2148 12/05/14 2110 12/07/14 2129  Weight: 100.8 kg (222 lb 3.6 oz) 100.8 kg (222 lb 3.6 oz) 102.1 kg (225 lb 1.4 oz)    Exam:   General:  Awake, laying in bed, in nad  Cardiovascular: regular, s1, s2  Respiratory: normal resp effort, no wheezing, bronchial breath sounds with rales in the right base  Abdomen: soft,nondistended  Musculoskeletal: perfused, no clubbing, no edema  Data Reviewed: Basic Metabolic Panel:  Recent Labs Lab 12/03/14 0535 12/04/14  0402 12/06/14 0717 12/07/14 0605 12/08/14 0532  NA 138 139 139 137 140  K 3.6 3.6 3.6 3.6 3.3*  CL 107 113* 104 99 96  CO2 28 23 27 28 31   GLUCOSE 111* 108* 141* 116* 98  BUN 13 9 9 11 15   CREATININE 0.76 0.68 0.61 0.60 0.64  CALCIUM 7.9* 7.5* 8.2* 8.2* 8.7   Liver Function Tests:  Recent Labs Lab 12/02/14 1715 12/03/14 0535 12/06/14 0717  AST 30 31 49*  ALT 33 31 67*  ALKPHOS 103 86 96  BILITOT 0.3 0.2* 0.5  PROT 7.2 6.0 5.9*  ALBUMIN 3.6 3.0* 2.9*    Recent Labs Lab 12/02/14 1715  LIPASE 20    No results for input(s): AMMONIA in the last 168 hours. CBC:  Recent Labs Lab 12/02/14 1715 12/03/14 0535 12/04/14 0402 12/06/14 0717 12/08/14 0532  WBC 8.4 6.8 6.8 5.2 9.2  NEUTROABS 6.6  --   --  3.5  --   HGB 13.1 11.6* 10.9* 11.4* 12.7  HCT 39.8 35.8* 33.6* 34.3* 38.3  MCV 92.8 94.0 93.6 92.0 92.5  PLT 274 234 207 226 320   Cardiac Enzymes: No results for input(s): CKTOTAL, CKMB, CKMBINDEX, TROPONINI in the last 168 hours. BNP (last 3 results)  Recent Labs  12/05/14 1820  BNP 51.0    ProBNP (last 3 results) No results for input(s): PROBNP in the last 8760 hours.  CBG: No results for input(s): GLUCAP in the last 168 hours.  Recent Results (from the past 240 hour(s))  Urine culture     Status: None   Collection Time: 12/02/14  5:02 PM  Result Value Ref Range Status   Specimen Description URINE, RANDOM  Final   Special Requests ADDED [phone] [id number]  Final   Colony Count NO GROWTH Performed at [facility]   Final   Culture NO GROWTH [facility]formed at [facility]   Final   Report Status 12/04/2014 FINAL  Final  Culture, blood (routine x 2)     Status: None (Preliminary result)   Collection Time: 12/03/14  8:51 AM  Result Value Ref Range Status   Specimen Description BLOOD LEFT ANTECUBITAL  Final   Special Requests BOTTLES DRAWN AEROBIC AND ANAEROBIC 10CC EACH  Final   Culture   Final           BLOOD CULTURE RECEIVED NO GROWTH TO DATE CULTURE WILL BE HELD FOR 5 DAYS BEFORE ISSUING A FINAL NEGATIVE REPORT Performed at [facility]    Report Status PENDING  Incomplete  Culture, blood (routine x 2)     Status: None (Preliminary result)   Collection Time: 12/03/14  8:56 AM  Result Value Ref Range Status   Specimen Description BLOOD RIGHT HAND  Final   Special Requests BOTTLES DRAWN AEROBIC AND ANAEROBIC 5CC EACH  Final   Culture   Final           BLOOD CULTURE RECEIVED NO GROWTH TO DATE CULTURE WILL BE HELD FOR 5 DAYS BEFORE ISSUING A  FINAL NEGATIVE REPORT Performed at [facility]    Report Status PENDING  Incomplete  Clostridium Difficile by PCR     Status: None   Collection Time: 12/03/14  8:00 PM  Result Value Ref Range Status   C difficile by pcr NEGATIVE NEGATIVE Final     Studies: Dg Chest Port 1 View  12/07/2014   CLINICAL DATA:  Shortness of Breath  EXAM: PORTABLE CHEST - 1 VIEW  COMPARISON:  12/06/2014  FINDINGS: Cardiac shadow is stable. Persistent bilateral infiltrates are again identified. The overall appearance is stable from the previous day. Elevation of the right hemidiaphragm is again noted.  IMPRESSION: Stable appearance when compared with the previous day.   Electronically Signed   By: Alcide Clever M.D.   On: 12/07/2014 11:41    Scheduled Meds: . albuterol  2.5 mg Nebulization Q6H  . ceFEPime (MAXIPIME) IV  1 g Intravenous 3 times per day  . enoxaparin (LOVENOX) injection  40 mg Subcutaneous Q24H  . furosemide  40 mg Intravenous BID  . predniSONE  60 mg Oral Q breakfast  . vancomycin  1,000 mg Intravenous Q8H   Continuous Infusions:   Principal Problem:   H1N1 influenza Active Problems:   Viral gastroenteritis   Dehydration   Cough   Influenza with respiratory manifestations   Headache  Colleen Jacobson  [facility] Pager [phone]. If 7PM-7AM, please contact night-coverage at [email or web address], password [facility] 12/08/2014, 10:34 AM  LOS: 4 days

## 2014-12-08 NOTE — Progress Notes (Signed)
ANTIBIOTIC CONSULT NOTE - FOLLOW UP  Pharmacy Consult for Vancomycin, Cefepime Indication: R/o HCAP   No Known Allergies  Patient Measurements: Height: 5' (152.4 cm) Weight: 225 lb 1.4 oz (102.1 kg) IBW/kg (Calculated) : 45.5  Vital Signs: Temp: 98.3 F (36.8 C) (04/06 0541) Temp Source: Oral (04/06 0541) BP: 128/58 mmHg (04/06 0541) Pulse Rate: 83 (04/06 0541) Intake/Output from previous day: 04/05 0701 - 04/06 0700 In: 597 [P.O.:597] Out: 2850 [Urine:2850] Intake/Output from this shift:    Labs:  Recent Labs  12/06/14 0717 12/07/14 0605 12/08/14 0532  WBC 5.2  --  9.2  HGB 11.4*  --  12.7  PLT 226  --  320  CREATININE 0.61 0.60 0.64   Estimated Creatinine Clearance: 88.4 mL/min (by C-G formula based on Cr of 0.64).  Recent Labs  12/08/14 0728  VANCOTROUGH 6.1*     Microbiology:   Medical History: History reviewed. No pertinent past medical history.  Medications:  Prescriptions prior to admission  Medication Sig Dispense Refill Last Dose  . acetaminophen (TYLENOL) 500 MG tablet Take 500 mg by mouth every 6 (six) hours as needed.   12/01/2014 at Unknown time  . naproxen (NAPROSYN) 500 MG tablet Take 500 mg by mouth 2 (two) times daily with a meal.   12/01/2014 at Unknown time   Assessment: Flu 54 y/o M with the flu completed treatment with Tamiflu. Now afebrile with normal WBC. CXR with improving pulmonary infiltrates   Currently on empiric Vancomycin and Cefepime for r/o HCAP. Day # 3 of antibiotics. Cultures remain negative. A vancomycin trough collected today is sub-therapeutic at 6.1.   Tamiflu 5/5 complete  Vanco 4/3>> Cefepime 4/3 x8d>>  Cultures: 3/31: UCx neg 4/1 Blood Cx x2>> ngtd Flu positive   Goal of Therapy:  Vancomycin trough level 15-20 mcg/ml  Plan:  -Cefepime 1g IV q8hr -Increase Vancomycin to 1 gm IV Q 8 hours  -[facility]  -Consider stopping vancomycin soon since no real MRSA risk factors and de-escalate from Cefepime  to Levaquin when patient improves clinically.   Vinnie LevelBenjamin Skyanne Welle, PharmD., BCPS Clinical Pharmacist Pager [phone]

## 2014-12-09 DIAGNOSIS — J9601 Acute respiratory failure with hypoxia: Secondary | ICD-10-CM

## 2014-12-09 DIAGNOSIS — J80 Acute respiratory distress syndrome: Secondary | ICD-10-CM

## 2014-12-09 LAB — CULTURE, BLOOD (ROUTINE X 2)
Culture: NO GROWTH
Culture: NO GROWTH

## 2014-12-09 LAB — BASIC METABOLIC PANEL
Anion gap: 10 (ref 5–15)
BUN: 16 mg/dL (ref 6–23)
CO2: 30 mmol/L (ref 19–32)
Calcium: 8.6 mg/dL (ref 8.4–10.5)
Chloride: 100 mmol/L (ref 96–112)
Creatinine, Ser: 0.65 mg/dL (ref 0.50–1.10)
Glucose, Bld: 89 mg/dL (ref 70–99)
Potassium: 3.7 mmol/L (ref 3.5–5.1)
Sodium: 140 mmol/L (ref 135–145)

## 2014-12-09 MED ORDER — HYDROCOD POLST-CHLORPHEN POLST 10-8 MG/5ML PO LQCR
5.0000 mL | Freq: Two times a day (BID) | ORAL | Status: DC | PRN
Start: 2014-12-09 — End: 2017-11-25

## 2014-12-09 MED ORDER — ALBUTEROL SULFATE (2.5 MG/3ML) 0.083% IN NEBU
2.5000 mg | INHALATION_SOLUTION | Freq: Three times a day (TID) | RESPIRATORY_TRACT | Status: DC
  Administered 2014-12-09 (×2): 2.5 mg via RESPIRATORY_TRACT
  Filled 2014-12-09 (×2): qty 3

## 2014-12-09 MED ORDER — LEVOFLOXACIN 750 MG PO TABS: 750.0000 mg | ORAL_TABLET | Freq: Every day | ORAL | Status: DC

## 2014-12-09 NOTE — Care Management Note (Signed)
CARE MANAGEMENT NOTE 12/09/2014  Patient:  Colleen Jacobson,Colleen Jacobson   Account Number:  [id number]  Date Initiated:  12/09/2014  Documentation initiated by:  Aryannah Mohon  Subjective/Objective Assessment:   CM has been following this pt for progression and d/c planning.     Action/Plan:   [facility] Friday, December 10, 2014 @ 2:30pm. [facility]   Anticipated DC Date:  12/09/2014   Anticipated DC Plan:  HOME/SELF CARE      DC Planning Services  MATCH Program      Choice offered to / List presented to:             Status of service:  Completed, signed off Medicare Important Message given?  NO (If response is "NO", the following Medicare IM given date fields will be blank) Date Medicare IM given:   Medicare IM given by:   Date Additional Medicare IM given:   Additional Medicare IM given by:    Discharge Disposition:  HOME/SELF CARE  Per UR Regulation:    If discussed at Long Length of Stay Meetings, dates discussed:    Comments:

## 2014-12-09 NOTE — Discharge Summary (Signed)
Physician Discharge Summary  Colleen Jacobson ZOX:[id number] DOB: 03/20/1961 DOA: 12/02/2014  PCP: No PCP Per Patient  Admit date: 12/02/2014 Discharge date: 12/09/2014  Recommendations for Outpatient Follow-up:  1. F/u with community [facility] wellness in 2 weeks for follow up of pneumonia and influenza 2. Please repeat CMP to check potassium and LFTs  Discharge Diagnoses:  Principal Problem:   H1N1 influenza Active Problems:   Viral gastroenteritis   Dehydration   Cough   Influenza with respiratory manifestations   Headache   Sepsis   ARDS (adult respiratory distress syndrome)   Acute respiratory failure with hypoxia   Discharge Condition: stable, improved  Diet recommendation: regular  Wt Readings from Last 3 Encounters:  12/08/14 102.3 kg (225 lb 8.5 oz)  08/13/11 90.719 kg (200 lb)    History of present illness:   54 yo F with no significant past medical history who presented with nausea, vomiting, diarrhea, and cough for three days prior to presentation.  She was positive for H1N1 and started on tamiflu. She continued to have SOB and required 4-5L nasal canula to maintain oxygen saturations.  She also developed headache and CT head obtained, which was unremarkable. Headache resolved.  Because of slow improvement, repeat CXR was obtained and she was found to have PNA vs ARDS on cxr.  Lasix and broad spec abx were started.  Her fevers, leukocytosis, and symptoms gradually improved.  By the time of discharge, she was on room air and able to ambulate with oxygen levels > 88%.    Hospital Course:    H1N1 Influenza with sepsis (fever, leukocytosis)  Started tamiflu on 4/1 and completed in hospital  Pt's significant other is sick contact who recently recovered from flu himself  Blood cultures neg x 2  Acute hypoxic respiratory failure secondary to influenza pneumonia, possible HCAP vs early ARDS  Repeat CXR after several days of tamiflu demonstrated worsening B  patchy airspace opacities  She was started on lasix 40mg  IV BID and had good urine output but little change on f/u CXR.  This raised concern for possible ARDS  Discussed case with Critical Care who recommended continued Tamiflu and diuretics but adding abx for HCAP.   She received steroids for pneumonia and completed a 5-day course in the hospital.    She also received q4hr albuterol nebs scheduled.  Continued vancomycin and cefepime for 5 days, and transitioned to levofloxacin for 2 additional days post-discharge   2d echo was unremarkable with normal EF  Dehydration at presentation  Resolved with IVF  N/v and diarrhea, common symptoms of the flu this year   Cdiff was neg  Resolved with anti-emetics and supportive care  Headache  No photophobia or phonophobia  Given toradol PRN +/- reglan and benadryl  Discussed case with Neurology. Pt neurologically intact, no neck stiffness on my exam  Head CT w/ and w/o contrast normal. Doubt meningitis  resolved  Obesity, Stable  Constipation  Given miralax and prn bisacodyl  Hypokalemia due to lasix  Given oral potassium repletion  Mild transaminitis, likely related to flu.  Repeat as outpatient in a few weeks  Consultants:  PCCM by phone  Procedures:  CXR  Antibiotics:  Tamiflu 4/1>>>   vanc 4/3 >>  Cefepime 4/3 >>  Discharge Exam: Filed Vitals:   12/09/14 0901  BP: 114/62  Pulse: 73  Temp: 98.5 F (36.9 C)  Resp: 17   Filed Vitals:   12/08/14 2159 12/09/14 0130 12/09/14 0611 12/09/14 0901  BP:  118/42  105/56 114/62  Pulse: 71  76 73  Temp: 98.2 F (36.8 C)  98.5 F (36.9 C) 98.5 F (36.9 C)  TempSrc: Oral  Oral Oral  Resp: 17  18 17   Height:      Weight: 102.3 kg (225 lb 8.5 oz)     SpO2: 96% 95% 99% 94%     General: Awake, sitting up in bed, in nad  Cardiovascular: regular, no mrg, s1, s2  Respiratory: normal resp effort, no wheezing, bronchial breath sounds with rales in the  right base  Abdomen: soft,nondistended  Musculoskeletal: perfused, no clubbing, no edema  Discharge Instructions      Discharge Instructions    Call MD for:  difficulty breathing, headache or visual disturbances    Complete by:  As directed      Call MD for:  extreme fatigue    Complete by:  As directed      Call MD for:  hives    Complete by:  As directed      Call MD for:  persistant dizziness or light-headedness    Complete by:  As directed      Call MD for:  persistant nausea and vomiting    Complete by:  As directed      Call MD for:  severe uncontrolled pain    Complete by:  As directed      Call MD for:  temperature >100.4    Complete by:  As directed      Diet general    Complete by:  As directed      Discharge instructions    Complete by:  As directed   You completed you treatment for flu in the hospital and do not need to take any more tamiflu.  You should continue antibiotics at home for two more days, next dose of levofloxacin due tomorrow morning.  If you have worsening shortness of breath or cough or high fevers, please return to the hospital right away.     Increase activity slowly    Complete by:  As directed             Medication List    TAKE these medications        acetaminophen 500 MG tablet  Commonly known as:  TYLENOL  Take 500 mg by mouth every 6 (six) hours as needed.     chlorpheniramine-HYDROcodone 10-8 MG/5ML Lqcr  Commonly known as:  TUSSIONEX  Take 5 mLs by mouth every 12 (twelve) hours as needed for cough.     levofloxacin 750 MG tablet  Commonly known as:  LEVAQUIN  Take 1 tablet (750 mg total) by mouth daily.     naproxen 500 MG tablet  Commonly known as:  NAPROSYN  Take 500 mg by mouth 2 (two) times daily with a meal.       Follow-up Information    Follow up with [facility]    . Schedule an appointment as soon as possible for a visit in 2 weeks.   Contact information:   [address] [place] [id number] [phone]       The results of significant diagnostics from this hospitalization (including imaging, microbiology, ancillary and laboratory) are listed below for reference.    Significant Diagnostic Studies: Dg Chest 2 View  12/02/2014   CLINICAL DATA:  Fever. Earnstine Meinders of breath. Cough for 3 days. Hypertension.  EXAM: CHEST  2 VIEW  COMPARISON:  None.  FINDINGS: Low volume chest. Low volume produces crowding of the pulmonary vasculature. Basilar atelectasis. No gross focal consolidation. The cardiopericardial silhouette appears within normal limits.  IMPRESSION: Low volume chest.   Electronically Signed   By: Andreas Newport M.D.   On: 12/02/2014 19:53   Ct Head W Wo Contrast  12/04/2014   CLINICAL DATA:  Nausea, vomiting and headache.  EXAM: CT HEAD WITHOUT AND WITH CONTRAST  TECHNIQUE: Contiguous axial images were obtained from the base of the skull through the vertex without and with intravenous contrast  CONTRAST:  80mL OMNIPAQUE IOHEXOL 300 MG/ML  SOLN  COMPARISON:  None.  FINDINGS: The brain has normal appearance without evidence of malformation, atrophy, old or acute infarction, mass lesion, hemorrhage, hydrocephalus or extra-axial collection. No abnormal enhancement occurs. No calvarial abnormality. No inflammatory sinus disease.  IMPRESSION: Normal examination.   Electronically Signed   By: Paulina Fusi M.D.   On: 12/04/2014 20:05   Dg Chest Port 1 View  12/08/2014   CLINICAL DATA:  Chest pain and pressure.  EXAM: PORTABLE CHEST - 1 VIEW  COMPARISON:  12/07/2014 and 12/06/2014  FINDINGS: Lungs are hypoinflated with subtle airspace density over the right upper lobe unchanged. No evidence of effusion. Cardiomediastinal silhouette and remainder of the exam is unchanged.  IMPRESSION: Subtle hazy airspace density over the right upper lobe which may represent atelectasis or infection.   Electronically Signed   By: Elberta Fortis M.D.   On: 12/08/2014 15:09    Dg Chest Port 1 View  12/07/2014   CLINICAL DATA:  Shortness of Breath  EXAM: PORTABLE CHEST - 1 VIEW  COMPARISON:  12/06/2014  FINDINGS: Cardiac shadow is stable. Persistent bilateral infiltrates are again identified. The overall appearance is stable from the previous day. Elevation of the right hemidiaphragm is again noted.  IMPRESSION: Stable appearance when compared with the previous day.   Electronically Signed   By: Alcide Clever M.D.   On: 12/07/2014 11:41   Dg Chest Port 1 View  12/06/2014   CLINICAL DATA:  Shortness of breath. Upper respiratory tract infection.  EXAM: PORTABLE CHEST - 1 VIEW  COMPARISON:  None.  FINDINGS: Mediastinum and hilar structures normal. Cardiomegaly is stable. Patchy bilateral pulmonary interstitial infiltrates have partially cleared . Nodular interstitial infiltrates remaining and continued follow-up chest x-rays recommended demonstrate to complete clearing. These changes could be related to pneumonitis and/or interstitial edema. No pneumothorax. No acute bony abnormality .  IMPRESSION: Persistent but improving bilateral pulmonary interstitial nodular infiltrates. Continued follow-up chest x-rays recommended to demonstrate complete clearing.   Electronically Signed   By: Maisie Fus  Register   On: 12/06/2014 08:53   Dg Chest Port 1 View  12/05/2014   CLINICAL DATA:  Shortness of breath and upper respiratory infection  EXAM: PORTABLE CHEST - 1 VIEW  COMPARISON:  12/05/2014 at 8:13 a.m.  FINDINGS: Patchy multi lobar airspace opacities are reidentified. Mild enlargement of the cardiomediastinal silhouette is reidentified. Trace if any pleural effusions. No acute osseous abnormality.  IMPRESSION: Stable multi lobar patchy airspace opacities which could reflect pneumonia or other alveolar filling processes such as ARDS.   Electronically Signed   By: Christiana Pellant M.D.   On: 12/05/2014 16:10   Dg Chest Port 1 View  12/05/2014   CLINICAL DATA:  Acute shortness of breath, cough and  congestion  EXAM: PORTABLE CHEST - 1 VIEW  COMPARISON:  12/02/2014  FINDINGS: Persistent low lung volumes. Worsening bilateral patchy nodular airspace disease most pronounced in the right  lung and in the left lower lobe concerning for pneumonia. Heart is enlarged which may be related to supine portable technique. No large effusion. No pneumothorax. Trachea midline. No acute osseous finding.  IMPRESSION: Worsening patchy bilateral airspace process concerning for bilateral pneumonia.   Electronically Signed   By: Judie Petit.  Shick M.D.   On: 12/05/2014 08:35    Microbiology: Recent Results (from the past 240 hour(s))  Urine culture     Status: None   Collection Time: 12/02/14  5:02 PM  Result Value Ref Range Status   Specimen Description URINE, RANDOM  Final   Special Requests ADDED [id number]  Final   Colony Count NO GROWTH Performed at [facility]   Final   Culture NO GROWTH Performed at [facility]   Final   Report Status 12/04/2014 FINAL  Final  Culture, blood (routine x 2)     Status: None   Collection Time: 12/03/14  8:51 AM  Result Value Ref Range Status   Specimen Description BLOOD LEFT ANTECUBITAL  Final   Special Requests BOTTLES DRAWN AEROBIC AND ANAEROBIC 10CC EACH  Final   Culture   Final    NO GROWTH 5 DAYS Note: Culture results may be compromised due to an excessive volume of blood received in culture bottles. Performed at [facility]    Report Status 12/09/2014 FINAL  Final  Culture, blood (routine x 2)     Status: None   Collection Time: 12/03/14  8:56 AM  Result Value Ref Range Status   Specimen Description BLOOD RIGHT HAND  Final   Special Requests BOTTLES DRAWN AEROBIC AND ANAEROBIC 5CC EACH  Final   Culture   Final    NO GROWTH 5 DAYS Performed at [facility]    Report Status 12/09/2014 FINAL  Final  Clostridium Difficile by PCR     Status: None   Collection Time: 12/03/14  8:00 PM  Result Value Ref Range Status   C difficile by  pcr NEGATIVE NEGATIVE Final     Labs: Basic Metabolic Panel:  Recent Labs Lab 12/04/14 0402 12/06/14 0717 12/07/14 0605 12/08/14 0532 12/09/14 0635  NA 139 139 137 140 140  K 3.6 3.6 3.6 3.3* 3.7  CL 113* 104 99 96 100  CO2 GLUCOSE 108* 141* 116* 98 89  BUN CREATININE 0.68 0.61 0.60 0.64 0.65  CALCIUM 7.5* 8.2* 8.2* 8.7 8.6   Liver Function Tests:  Recent Labs Lab 12/02/14 1715 12/03/14 0535 12/06/14 0717  AST 30 31 49*  ALT 33 31 67*  ALKPHOS 103 86 96  BILITOT 0.3 0.2* 0.5  PROT 7.2 6.0 5.9*  ALBUMIN 3.6 3.0* 2.9*    Recent Labs Lab 12/02/14 1715  LIPASE 20   No results for input(s): AMMONIA in the last 168 hours. CBC:  Recent Labs Lab 12/02/14 1715 12/03/14 0535 12/04/14 0402 12/06/14 0717 12/08/14 0532  WBC 8.4 6.8 6.8 5.2 9.2  NEUTROABS 6.6  --   --  3.5  --   HGB 13.1 11.6* 10.9* 11.4* 12.7  HCT 39.8 35.8* 33.6* 34.3* 38.3  MCV 92.8 94.0 93.6 92.0 92.5  PLT 274 234 207 226 320   Cardiac Enzymes: No results for input(s): CKTOTAL, CKMB, CKMBINDEX, TROPONINI in the last 168 hours. BNP: BNP (last 3 results)  Recent Labs  12/05/14 1820  BNP 51.0    ProBNP (last 3 results) No results for input(s): PROBNP in  the last 8760 hours.  CBG: No results for input(s): GLUCAP in the last 168 hours.  Time coordinating discharge: 45 minutes  Signed:  Thais Silberstein  [facility] 12/09/2014, 9:05 AM

## 2014-12-10 ENCOUNTER — Ambulatory Visit: Payer: Self-pay | Attending: Family Medicine | Admitting: Family Medicine

## 2014-12-10 ENCOUNTER — Encounter: Payer: Self-pay | Admitting: Family Medicine

## 2014-12-10 VITALS — BP 110/76 | HR 79 | Temp 98.2°F | Resp 18 | Ht 61.0 in | Wt 214.0 lb

## 2014-12-10 DIAGNOSIS — E669 Obesity, unspecified: Secondary | ICD-10-CM

## 2014-12-10 DIAGNOSIS — J189 Pneumonia, unspecified organism: Secondary | ICD-10-CM | POA: Insufficient documentation

## 2014-12-10 DIAGNOSIS — R748 Abnormal levels of other serum enzymes: Secondary | ICD-10-CM

## 2014-12-10 DIAGNOSIS — J80 Acute respiratory distress syndrome: Secondary | ICD-10-CM

## 2014-12-10 DIAGNOSIS — J101 Influenza due to other identified influenza virus with other respiratory manifestations: Secondary | ICD-10-CM

## 2014-12-10 MED ORDER — LEVOFLOXACIN 750 MG PO TABS: 750.0000 mg | ORAL_TABLET | Freq: Every day | ORAL | Status: DC

## 2014-12-10 NOTE — Patient Instructions (Signed)
Pruebas de la gripe (Influenza Tests) Su mdico solicita estas pruebas para determinar si es probable que usted tenga una infeccin causada por el virus de la gripe. A la gripe tambin se la conoce como "influenza". Los [employer] de las pruebas pueden ayudar a su mdico a tomar decisiones rpidas en cuanto al tratamiento y a determinar si la gripe ha llegado a su comunidad. Las pruebas de la gripe se basan en la deteccin de un virus que se Nature conservation officer en las secreciones respiratorias de la persona infectada. El virus de la gripe detectable suele liberarse nicamente durante los primeros das de la enfermedad. Por lo tanto, las pruebas deben realizarse durante este perodo inicial.  Qu[place] Fetter a evaluar depende del mtodo de la prueba que se [place] y de la edad del [place]. Generalmente, los mtodos usados son los siguientes:   Exudado nasofarngeo (EN).  Aspirado nasal.  Lavado nasal.  Cultivo farngeo en circunstancias aprobadas. La gripe es una enfermedad respiratoria frecuente que normalmente afecta a muchas personas en la temporada de invierno. Hay dos tipos de virus de la gripe: el A y el B. Por lo general, una nica cepa del virus de la gripeA predominar durante una temporada gripal especfica. Sin embargo, puede haber una combinacin de las cepasA yB que causan brotes en la comunidad al [employer]. Los sntomas habituales son los siguientes:  Dolor de [place].  Agotamiento.  Grant Ruts.  Nariz tapada.  Escalofros.  Dolor de Advertising copywriter.  Dolores musculares.  Tos. Se han desarrollado medicamentos antivirales para tratar la gripeA sola o laA y laB. Si se administran estos [employer] las primeras 48horas a partir de la aparicin de los sntomas, pueden aliviar su intensidad. Adems, pueden reducir aproximadamente un da el tiempo que un paciente est enfermo y Film/video editor la diseminacin de la infeccin a otros miembros de la familia o de la casa. Estos medicamentos no tendrn el  efecto deseado si se administran despus de las primeras 48horas, a partir de la aparicin de los sntomas. Tampoco son eficaces para combatir las infecciones causadas por otros virus o aquellas causadas por alguna bacteria.  Hay pruebas rpidas de la gripe disponibles que se han convertido en la opcin usada con mayor frecuencia para esta enfermedad.   En funcin del mtodo, pueden Psychologist, educational del mdico en menos de o enviarse a un laboratorio, y los resultados estarn listos el [employer].  Dependiendo del tipo de prueba especfica que se use, esta puede identificar la gripeA, una combinacin de laA y laB, o diferenciar entre laA y laB.  Se pueden usar las pruebas rpidas de la gripe para ayudar a Theme park manager, determinar las opciones de tratamiento para un paciente y, si los [place] son negativos, puede sugerir la presencia de una enfermedad Civil Service fast streamer. Estas pruebas no son perfectas porque hasta el 30% de los [employer]Williams personas con infeccin gripal sern negativos. Adems, ocasionalmente, los [employer] pruebas sern positivos aun cuando el paciente no tenga una infeccin gripal. Tommi Rumps todos modos, es una forma rpida de determinar si Neomia Dear persona tiene alguna probabilidad de Warehouse manager gripe. Otra prueba que su mdico puede pedir es un cultivo viral   en el cual el virus gripal se cultiva y se identifica en el laboratorio.  Tiene la ventaja de identificar qu virus (elA, elB o incluso algunos otros) y qu cepas del virus gripal estn presentes. Es til para Educational psychologist gripe (A oB) ha llegado a Neomia Dear comunidad y para  identificar los brotes en poblaciones especficas, por ejemplo, hogares de ancianos, escuelas o vecindarios. Identificar estos brotes puede ayudar a los trabajadores de la salud en la prevencin y el tratamiento de la gripe en toda Neomia Dearuna comunidad.  Su principal desventaja es que el resultado tarda uno a varios das, lo  que est lejos de ser lo ideal para el mdico que deseara recetar un tratamiento mientras el paciente an est en el consultorio. Normalmente, las pruebas rpidas de la gripe se usan durante las primeras 48horas despus de la aparicin de los sntomas. Esto ayuda a Secondary school teacherdiagnosticar la gripe y Chief Strategy Officerdeterminar si los antivirales pueden ofrecer algn beneficio. Tambin se las Enbridge Energypuede solicitar en el trmino de la primera semana para ayudar a identificar los brotes. A menudo se usan los cultivos virales para hacer el seguimiento de los brotes de gripe. Estos tambin identifican la cepa especfica que los causa. A veces, aparecer una cepa de gripe inesperada. Adems, los cultivos virales se usan para identificar otras infecciones virales que causan sntomas clnicos similares a los de Emergency planning/management officerla gripe. SIGNIFICADO DEL ESTUDIO  El mdico Avon Productsanalizar los resultados de los estudios con usted y Metallurgistle hablar sobre la importancia de Menomineeeste anlisis. En general, el resultado positivo de la prueba de la gripe significa que muy probablemente tenga la enfermedad. No le informa a su mdico qu tan intensos pueden ser los sntomas o si puede tener complicaciones secundarias debido a esta infeccin.  Los Norfolk Southernresultados positivos de los cultivos de gripe pueden brindarle a su mdico informacin adicional sobre la cepa que le est provocando la infeccin. A menudo, esta informacin no se obtiene a tiempo para favorecer su recuperacin. Sin embargo, se trata de informacin til para que sus mdicos compartan con otros e mientras tratan a su familia, amigos y vecinos, y con los organismos de salud pblica de su comunidad que ayudan a imponer all intervenciones preventivas.  Los Lear Corporationresultados negativos de las pruebas de la gripe pueden significar que tiene una afeccin que no es gripe y, De Kalbtambin, que no hay suficiente virus en la Warrenmuestra como para que este pueda ser detectado.  OBTENCIN DE LOS RESULTADOS DE LAS PRUEBAS  Asegrese de obtener los BJ's Wholesaleresultados  de las pruebas. Pregunte cmo puede obtenerlos si no se lo han informado. Es su responsabilidad retirar el resultado del Occoquanestudio.  Su mdico le proporcionar ms instrucciones u opciones de tratamiento, si es necesario. Document Released: 06/10/2013 [facility] Patient Information 2015 [place], [place]. This information is not intended to replace advice given to you by your health care provider. Make sure you discuss any questions you have with your health care provider.

## 2014-12-10 NOTE — Progress Notes (Signed)
Subjective:    Patient ID: Colleen Jacobson, female    DOB: March 10, 1961, 54 y.o.   MRN: [id number]  HPI  Colleen Jacobson is seen with the aid of a Spanish interpreter on a language line for a hospital follow up after being admitted between 12/02/14 and 12/09/14.   She had [facility] with nausea, vomiting, diarrhea, cough , hypoxia with oxygen saturation around 93%. She tested positive for H1N1., blood cultures were negative. Liver enzymes were also mildly elevated.  She was placed on Tamiflu and was on 4-5L of oxygen with no much improvement in symptoms. CXR done was suggestive of bilateral Pneumonia vs ARDS; she received IV Vancomycin and Cefepime, IV steroids, IV Lasix, nebulizer treatments with resulting improvement in symptoms to the point of discontinuation of oxygen.  Interval History: She states she feels somewhat better buts gets short of breath with exertion; she states she is yet to pick up the Levaquin and antitussive which she received on discharge.  History reviewed. No pertinent past medical history.   Past Surgical History  Procedure Laterality Date  . Shoulder surgery       History   Social History  . Marital Status: Married    Spouse Name: N/A  . Number of Children: N/A  . Years of Education: N/A   Occupational History  . Not on file.   Social History Main Topics  . Smoking status: Never Smoker   . Smokeless tobacco: Not on file  . Alcohol Use: No  . Drug Use: No  . Sexual Activity: Not on file   Other Topics Concern  . Not on file   Social History Narrative     Allergies  Allergen Reactions  . Hydrocodone Nausea And Vomiting     Current Outpatient Prescriptions on File Prior to Visit  Medication Sig Dispense Refill  . acetaminophen (TYLENOL) 500 MG tablet Take 500 mg by mouth every 6 (six) hours as needed.    . chlorpheniramine-HYDROcodone (TUSSIONEX) 10-8 MG/5ML LQCR Take 5 mLs by mouth every 12 (twelve) hours as  needed for cough. (Patient not taking: Reported on 12/10/2014) 115 mL 0  . naproxen (NAPROSYN) 500 MG tablet Take 500 mg by mouth 2 (two) times daily with a meal.     No current facility-administered medications on file prior to visit.         Review of Systems  Constitutional: Positive for appetite change and fatigue. Negative for activity change.  HENT: Negative for congestion, sinus pressure and sore throat.   Eyes: Negative for visual disturbance.  Respiratory: Positive for cough and shortness of breath. Negative for chest tightness and wheezing.   Cardiovascular: Negative for chest pain and palpitations.  Gastrointestinal: Negative for abdominal pain, constipation and abdominal distention.  Endocrine: Negative for polydipsia.  Genitourinary: Negative for dysuria and frequency.  Musculoskeletal: Negative for back pain and arthralgias.  Skin: Negative for rash.  Neurological: Negative for tremors, light-headedness and numbness.  Hematological: Does not bruise/bleed easily.  Psychiatric/Behavioral: Negative for behavioral problems and agitation.         Objective:  Filed Vitals:   12/10/14 1445  BP: 110/76  Pulse: 79  Temp: 98.2 F (36.8 C)  Resp: 18    Pulse oximetry on room air is 92-94%   Physical Exam  Constitutional: She is oriented to person, place, and time. She appears well-developed and well-nourished. No distress.  Obese  HENT:  Head: Normocephalic.  Right Ear: External ear normal.  Left  Ear: External ear normal.  Nose: Nose normal.  Mouth/Throat: Oropharynx is clear and moist.  Eyes: Conjunctivae and EOM are normal. Pupils are equal, round, and reactive to light.  Neck: Normal range of motion. No JVD present.  Cardiovascular: Normal rate, regular rhythm, normal heart sounds and intact distal pulses.  Exam reveals no gallop.   No murmur heard. Pulmonary/Chest: Effort normal. No respiratory distress. She has no decreased breath sounds. She has no  wheezes. She has no rales. She exhibits no tenderness.  Abdominal: Soft. Bowel sounds are normal. She exhibits no distension and no mass. There is no tenderness.  Musculoskeletal: Normal range of motion. She exhibits no edema or tenderness.  Neurological: She is alert and oriented to person, place, and time. She has normal reflexes.  Skin: Skin is warm and dry. She is not diaphoretic.  Psychiatric: She has a normal mood and affect.             Assessment & Plan:  54 year old female patient recently hospitalized for ARDS secondary to H1N1 and bilateral pneumonia, reporting some clinical improvement but oxygen saturation is still on the low side.  Pneumonia: Not fully resolved based on oxygen saturation. I am placing her on Levaquin for 5 more days and she will need to commence her antitussive. Will need to be reassessed at next office visit for improvement.  H1N1: Completed a course of Tamiflu during hospitalization.  Elevated liver enzymes: Thought to be secondary to acute illness She will need repeat levels at her next office visit.  Obesity: Advised to commence exercise as tolerate and reduce portion sized once she is back to her baseline

## 2014-12-10 NOTE — Progress Notes (Signed)
Hospital follow up for pneumonia and flu. Discharged from hospital yesterday. Patient reports feeling much better. Patient having pain in right side of chest when breathing in, makes it difficult to breath. Patient reports pain in right side of chest at level 5. Pain makes patient anxious when she lies down, she is afraid she is not going to breath anymore.

## 2014-12-27 ENCOUNTER — Ambulatory Visit: Payer: Self-pay | Admitting: Family Medicine

## 2016-12-06 ENCOUNTER — Encounter (HOSPITAL_COMMUNITY): Payer: Self-pay | Admitting: Emergency Medicine

## 2016-12-06 ENCOUNTER — Emergency Department (HOSPITAL_COMMUNITY): Payer: Self-pay

## 2016-12-06 ENCOUNTER — Emergency Department (HOSPITAL_COMMUNITY)
Admission: EM | Admit: 2016-12-06 | Discharge: 2016-12-07 | Disposition: A | Discharge status: Home / Self Care | Payer: Self-pay | Attending: Emergency Medicine | Admitting: Emergency Medicine

## 2016-12-06 DIAGNOSIS — R0789 Other chest pain: Secondary | ICD-10-CM | POA: Insufficient documentation

## 2016-12-06 DIAGNOSIS — M5412 Radiculopathy, cervical region: Secondary | ICD-10-CM | POA: Insufficient documentation

## 2016-12-06 HISTORY — DX: Obesity, unspecified: E66.9

## 2016-12-06 LAB — BASIC METABOLIC PANEL
ANION GAP: 10 (ref 5–15)
BUN: 18 mg/dL (ref 6–20)
CALCIUM: 10.2 mg/dL (ref 8.9–10.3)
CO2: 26 mmol/L (ref 22–32)
Chloride: 102 mmol/L (ref 101–111)
Creatinine, Ser: 0.69 mg/dL (ref 0.44–1.00)
GFR calc non Af Amer: >60 mL/min (ref >60)
Glucose, Bld: 114 mg/dL — ABNORMAL HIGH (ref 65–99)
Potassium: 3.7 mmol/L (ref 3.5–5.1)
SODIUM: 138 mmol/L (ref 135–145)

## 2016-12-06 LAB — I-STAT TROPONIN, ED: TROPONIN I, POC: 0 ng/mL (ref 0.00–0.08)

## 2016-12-06 LAB — CBC
HCT: 41.3 % (ref 36.0–46.0)
HEMOGLOBIN: 13.8 g/dL (ref 12.0–15.0)
MCH: 30.7 pg (ref 26.0–34.0)
MCHC: 33.4 g/dL (ref 30.0–36.0)
MCV: 92 fL (ref 78.0–100.0)
PLATELETS: 369 10*3/uL (ref 150–400)
RBC: 4.49 MIL/uL (ref 3.87–5.11)
RDW: 13.4 % (ref 11.5–15.5)
WBC: 13.5 10*3/uL — AB (ref 4.0–10.5)

## 2016-12-06 MED ORDER — CYCLOBENZAPRINE HCL 10 MG PO TABS: 10.0000 mg | ORAL_TABLET | Freq: Three times a day (TID) | ORAL | 0 refills | Status: DC | PRN

## 2016-12-06 MED ORDER — PREDNISONE 20 MG PO TABS: ORAL_TABLET | ORAL | 0 refills | Status: DC

## 2016-12-06 MED ORDER — TRAMADOL HCL 50 MG PO TABS: 50.0000 mg | ORAL_TABLET | Freq: Four times a day (QID) | ORAL | 0 refills | Status: DC | PRN

## 2016-12-06 NOTE — ED Provider Notes (Signed)
[facility]-EMERGENCY DEPT Provider Note   CSN: [id number] Arrival date & time: 12/06/16  2038  By signing my name below, I, [name]., attest that this documentation has been prepared under the direction and in the presence of [name], MD. Electronically signed: [name]., ED Scribe. 12/06/16. 11:53 PM.    History   Chief Complaint Chief Complaint  Patient presents with  . Chest Pain    HPI Colleen Jacobson is a 56 y.o. female who presents to the Emergency Department complaining of constant, mild chest pain with onset x1 week. Per daughter, pt has had waxing and waning chest pain that is worst with exertion for the past x1 week. Pt states that x1 day ago, she could not sleep because lying down exacerbates the pain. Pt states that she also has neck pain that she describes as a pressure. She states that the neck pain is worse in the morning. She denies neck pain to palpation but reports neck pain with ROM.    The history is provided by the patient and a relative. A language interpreter was used.    Past Medical History:  Diagnosis Date  . Obesity     Patient Active Problem List   Diagnosis Date Noted  . Obesity 12/10/2014  . Elevated liver enzymes 12/10/2014  . Sepsis (HCC) 12/08/2014  . ARDS (adult respiratory distress syndrome) (HCC) 12/08/2014  . Acute respiratory failure with hypoxia (HCC) 12/08/2014  . Headache 12/04/2014  . Cough 12/03/2014  . Influenza with respiratory manifestations 12/03/2014  . H1N1 influenza 12/03/2014  . Viral gastroenteritis 12/02/2014  . Dehydration 12/02/2014    Past Surgical History:  Procedure Laterality Date  . SHOULDER SURGERY      OB History    No data available       Home Medications    Prior to Admission medications   Medication Sig Start Date End Date Taking? Authorizing Provider  acetaminophen (TYLENOL) 500 MG tablet Take 500 mg by mouth every 6 (six) hours as needed.     Historical Provider, MD  chlorpheniramine-HYDROcodone (TUSSIONEX) 10-8 MG/5ML LQCR Take 5 mLs by mouth every 12 (twelve) hours as needed for cough. Patient not taking: Reported on 12/10/2014 12/09/14   Renae Fickle, MD  cyclobenzaprine (FLEXERIL) 10 MG tablet Take 1 tablet (10 mg total) by mouth 3 (three) times daily as needed for muscle spasms. 12/06/16   [name], MD  levofloxacin (LEVAQUIN) 750 MG tablet Take 1 tablet (750 mg total) by mouth daily. 12/10/14   Jaclyn Shaggy, MD  naproxen (NAPROSYN) 500 MG tablet Take 500 mg by mouth 2 (two) times daily with a meal.    Historical Provider, MD  predniSONE (DELTASONE) 20 MG tablet 3 tabs po daily x 3 days, then 2 tabs x 3 days, then 1.5 tabs x 3 days, then 1 tab x 3 days, then 0.5 tabs x 3 days 12/06/16   [name], MD  traMADol (ULTRAM) 50 MG tablet Take 1 tablet (50 mg total) by mouth every 6 (six) hours as needed. 12/06/16   [name], MD    Family History No family history on file.  Social History Social History  Substance Use Topics  . Smoking status: Never Smoker  . Smokeless tobacco: Never Used  . Alcohol use No     Allergies   Hydrocodone   Review of Systems Review of Systems  Constitutional: Negative for fever.  Cardiovascular: Positive for chest pain.  Musculoskeletal: Positive  for neck pain.  All other systems reviewed and are negative.    Physical Exam Updated Vital Signs BP 137/69 (BP Location: Left Arm)   Pulse 91   Temp 97.8 F (36.6 C) (Oral)   Resp 16   Ht  (1.549 m)   Wt 220 lb (99.8 kg)   SpO2 94%   BMI 41.57 kg/m   Physical Exam  Constitutional: She is oriented to person, place, and time. She appears well-developed and well-nourished. No distress.  HENT:  Head: Normocephalic and atraumatic.  Right Ear: Hearing normal.  Left Ear: Hearing normal.  Nose: Nose normal.  Mouth/Throat: Oropharynx is clear and moist and mucous membranes are normal.  Eyes: Conjunctivae  and EOM are normal. Pupils are equal, round, and reactive to light.  Neck: Normal range of motion. Neck supple.  Cardiovascular: Regular rhythm, S1 normal and S2 normal.  Exam reveals no gallop and no friction rub.   No murmur heard. Pulmonary/Chest: Effort normal and breath sounds normal. No respiratory distress. She exhibits tenderness.  Tenderness to the anterior chest wall.   Abdominal: Soft. Normal appearance and bowel sounds are normal. There is no hepatosplenomegaly. There is no tenderness. There is no rebound, no guarding, no tenderness at McBurney's point and negative Murphy's sign. No hernia.  Musculoskeletal: Normal range of motion.       Cervical back: She exhibits tenderness.  Diffuse cervical tenderness posterior with reproduced pain with turning side to side.   Neurological: She is alert and oriented to person, place, and time. She has normal strength. No cranial nerve deficit or sensory deficit. Coordination normal. GCS eye subscore is 4. GCS verbal subscore is 5. GCS motor subscore is 6.  Normal strength and sensation in bilateral upper extremities.   Skin: Skin is warm, dry and intact. No rash noted. No cyanosis.  Psychiatric: She has a normal mood and affect. Her speech is normal and behavior is normal. Thought content normal.  Nursing note and vitals reviewed.    ED Treatments / Results   DIAGNOSTIC STUDIES: Oxygen Saturation is 94% on RA, inadequate by my interpretation.   COORDINATION OF CARE: 11:53 PM-Discussed next steps with pt. Pt verbalized understanding and is agreeable with the plan.    Labs (all labs ordered are listed, but only abnormal results are displayed) Labs Reviewed  BASIC METABOLIC PANEL - Abnormal; Notable for the following:       Result Value   Glucose, Bld 114 (*)    All other components within normal limits  CBC - Abnormal; Notable for the following:    WBC 13.5 (*)    All other components within normal limits  I-STAT TROPOININ, ED     EKG  EKG Interpretation  Date/Time:  Thursday December 06 2016 20:54:02 EDT Ventricular Rate:  94 PR Interval:  156 QRS Duration: 84 QT Interval:  362 QTC Calculation: 452 R Axis:   -10 Text Interpretation:  Sinus rhythm with frequent Premature ventricular complexes Inferior infarct , age undetermined Anterior infarct , age undetermined Abnormal ECG No previous tracing Confirmed by POLLINA  MD, CHRISTOPHER [phone]) on 12/06/2016 11:21:55 PM       Radiology Dg Chest 2 View  Result Date: 12/06/2016 CLINICAL DATA:  Intermittent mid and left-sided chest pain and dyspnea EXAM: CHEST  2 VIEW COMPARISON:  12/08/2014 FINDINGS: The heart size and mediastinal contours are within normal limits. Mild coarsened interstitial prominence bilaterally may reflect chronic bronchitic change or sequela of mild chronic interstitial lung disease. Both lungs  are clear. Mild degenerative change along the lower thoracic spine. IMPRESSION: Slight increase in interstitial prominence and coarsening may reflect chronic bronchitic change or changes of mild chronic interstitial lung disease. Electronically Signed   By: Tollie Eth M.D.   On: 12/06/2016 21:33    Procedures Procedures (including critical care time)  Medications Ordered in ED Medications - No data to display   Initial Impression / Assessment and Plan / ED Course  I have reviewed the triage vital signs and the nursing notes.  Pertinent labs & imaging results that were available during my care of the patient were reviewed by me and considered in my medical decision making (see chart for details).     Patient presents to the emergency department for evaluation of neck pain, chest pain. Patient reports that symptoms have been ongoing for more than 1 week. Pain is much worse when she lies flat in her bed at night, improves if she sits up. She also notices pain with doing her chores, such as bending over and moving her neck. She denies any direct injury.  She does not have any numbness, tingling, weakness in the upper extremities, reflexes are normal. Patient has had pain for more than 1 week without relief, EKG is essentially normal and troponin was normal. This is not felt to be cardiac in nature. She does have reproducibility of the symptoms with range of motion of the neck as well as palpation of the chest. This is most consistent with cervical radiculopathy or musculoskeletal pain.  HEART Pathway for Early Discharge in Acute Chest Pain from [email or web address]  on 12/06/2016 ** All calculations should be rechecked by clinician prior to use **  RESULT SUMMARY: 2 points HEART Pathway Score  Low risk 0.9-1.7% 30-day MACE  Repeat troponin at 3 hours and if negative, discharge home with outpatient follow-up.   INPUTS: History -> 0 = Slightly suspicious EKG -> 0 = Normal Age -> 1 = 45-64 Risk factors -> 1 = 1-2 risk factors Initial troponin -> 0 = ?normal limit   Final Clinical Impressions(s) / ED Diagnoses   Final diagnoses:  Chest wall pain  Cervical radiculopathy    New Prescriptions New Prescriptions   CYCLOBENZAPRINE (FLEXERIL) 10 MG TABLET    Take 1 tablet (10 mg total) by mouth 3 (three) times daily as needed for muscle spasms.   PREDNISONE (DELTASONE) 20 MG TABLET    3 tabs po daily x 3 days, then 2 tabs x 3 days, then 1.5 tabs x 3 days, then 1 tab x 3 days, then 0.5 tabs x 3 days   TRAMADOL (ULTRAM) 50 MG TABLET    Take 1 tablet (50 mg total) by mouth every 6 (six) hours as needed.   I personally performed the services described in this documentation, which was scribed in my presence. The recorded information has been reviewed and is accurate.     [name], MD 12/06/16 [phone]

## 2016-12-06 NOTE — ED Notes (Signed)
Daughter (interpreting) reports pt drove to [place].

## 2016-12-06 NOTE — ED Triage Notes (Signed)
Patient reports intermittent mid/left chest pain with SOB , nausea and emesis onset last week , pain increases with deep inspiration , denies fever or diaphoresis .

## 2017-11-24 ENCOUNTER — Emergency Department (HOSPITAL_COMMUNITY)
Admission: EM | Admit: 2017-11-24 | Discharge: 2017-11-25 | Disposition: A | Discharge status: Home / Self Care | Payer: Self-pay | Attending: Emergency Medicine | Admitting: Emergency Medicine

## 2017-11-24 ENCOUNTER — Emergency Department (HOSPITAL_COMMUNITY): Payer: Self-pay

## 2017-11-24 ENCOUNTER — Encounter (HOSPITAL_COMMUNITY): Payer: Self-pay | Admitting: Nurse Practitioner

## 2017-11-24 DIAGNOSIS — R112 Nausea with vomiting, unspecified: Secondary | ICD-10-CM | POA: Insufficient documentation

## 2017-11-24 DIAGNOSIS — R1013 Epigastric pain: Secondary | ICD-10-CM | POA: Insufficient documentation

## 2017-11-24 DIAGNOSIS — R0602 Shortness of breath: Secondary | ICD-10-CM | POA: Insufficient documentation

## 2017-11-24 DIAGNOSIS — R109 Unspecified abdominal pain: Secondary | ICD-10-CM

## 2017-11-24 DIAGNOSIS — Z79899 Other long term (current) drug therapy: Secondary | ICD-10-CM | POA: Insufficient documentation

## 2017-11-24 LAB — CBC WITH DIFFERENTIAL/PLATELET
Basophils Absolute: 0 10*3/uL (ref 0.0–0.1)
Basophils Relative: 0 %
Eosinophils Absolute: 0.2 10*3/uL (ref 0.0–0.7)
Eosinophils Relative: 2 %
HEMATOCRIT: 40.3 % (ref 36.0–46.0)
HEMOGLOBIN: 13 g/dL (ref 12.0–15.0)
LYMPHS PCT: 25 %
Lymphs Abs: 2.7 10*3/uL (ref 0.7–4.0)
MCH: 31 pg (ref 26.0–34.0)
MCHC: 32.3 g/dL (ref 30.0–36.0)
MCV: 96.2 fL (ref 78.0–100.0)
MONO ABS: 0.4 10*3/uL (ref 0.1–1.0)
MONOS PCT: 4 %
NEUTROS ABS: 7.5 10*3/uL (ref 1.7–7.7)
NEUTROS PCT: 69 %
Platelets: 296 10*3/uL (ref 150–400)
RBC: 4.19 MIL/uL (ref 3.87–5.11)
RDW: 13.4 % (ref 11.5–15.5)
WBC: 10.9 10*3/uL — ABNORMAL HIGH (ref 4.0–10.5)

## 2017-11-24 LAB — LIPASE, BLOOD: LIPASE: 22 U/L (ref 11–51)

## 2017-11-24 LAB — URINALYSIS, ROUTINE W REFLEX MICROSCOPIC
Bacteria, UA: NONE SEEN
Bilirubin Urine: NEGATIVE
Glucose, UA: NEGATIVE mg/dL
Ketones, ur: NEGATIVE mg/dL
Leukocytes, UA: NEGATIVE
Nitrite: NEGATIVE
Protein, ur: NEGATIVE mg/dL
SPECIFIC GRAVITY, URINE: 1.015 (ref 1.005–1.030)
pH: 6 (ref 5.0–8.0)

## 2017-11-24 LAB — COMPREHENSIVE METABOLIC PANEL
ALK PHOS: 102 U/L (ref 38–126)
ALT: 20 U/L (ref 14–54)
AST: 18 U/L (ref 15–41)
Albumin: 3.7 g/dL (ref 3.5–5.0)
Anion gap: 9 (ref 5–15)
BILIRUBIN TOTAL: 0.5 mg/dL (ref 0.3–1.2)
BUN: 9 mg/dL (ref 6–20)
CALCIUM: 8.4 mg/dL — AB (ref 8.9–10.3)
CO2: 26 mmol/L (ref 22–32)
CREATININE: 0.66 mg/dL (ref 0.44–1.00)
Chloride: 106 mmol/L (ref 101–111)
GFR calc Af Amer: >60 mL/min (ref >60)
Glucose, Bld: 105 mg/dL — ABNORMAL HIGH (ref 65–99)
POTASSIUM: 3.5 mmol/L (ref 3.5–5.1)
Sodium: 141 mmol/L (ref 135–145)
TOTAL PROTEIN: 7 g/dL (ref 6.5–8.1)

## 2017-11-24 LAB — I-STAT CG4 LACTIC ACID, ED: LACTIC ACID, VENOUS: 1.2 mmol/L (ref 0.5–1.9)

## 2017-11-24 LAB — BRAIN NATRIURETIC PEPTIDE: B NATRIURETIC PEPTIDE 5: 65.9 pg/mL (ref 0.0–100.0)

## 2017-11-24 MED ORDER — ONDANSETRON HCL 4 MG/2ML IJ SOLN
4.0000 mg | Freq: Once | INTRAMUSCULAR | Status: AC
  Administered 2017-11-24: 4 mg via INTRAVENOUS
  Filled 2017-11-24: qty 2

## 2017-11-24 MED ORDER — SODIUM CHLORIDE 0.9 % IV SOLN
INTRAVENOUS | Status: DC
  Administered 2017-11-24: 22:00:00 via INTRAVENOUS

## 2017-11-24 MED ORDER — IOPAMIDOL (ISOVUE-300) INJECTION 61%
INTRAVENOUS | Status: AC
  Filled 2017-11-24: qty 100

## 2017-11-24 MED ORDER — SODIUM CHLORIDE 0.9 % IJ SOLN
INTRAMUSCULAR | Status: AC
  Filled 2017-11-24: qty 50

## 2017-11-24 NOTE — ED Triage Notes (Signed)
Triage hx obtained via Spanish interpretor: Pt is c/o an episode of emesis, abdominal bloating and feet swelling. Reports onset yesterday as she attended an event yesterday.

## 2017-11-24 NOTE — ED Notes (Signed)
EKG given to EDP,Allen,MD., for review. 

## 2017-11-24 NOTE — ED Provider Notes (Signed)
[facility]-EMERGENCY DEPT Provider Note   CSN: [id number] Arrival date & time: 11/24/17  2025     History   Chief Complaint Chief Complaint  Patient presents with  . Emesis  . Fever  . Rectal Bleeding    HPI Colleen Jacobson is a 57 y.o. female.  57 year old female presents with increasing edema times 24 hours and now with abdominal pain which is diffuse in nature and associate with emesis x1.  Subjective chills at home but no reported fever.  No congestion but some dyspnea.  Denies any urinary symptoms.  States that she has had swelling in her feet going to her ankles and legs.  No prior history of same.  Nothing makes her symptoms better or worse no treatment used prior to arrival.     Past Medical History:  Diagnosis Date  . Obesity     Patient Active Problem List   Diagnosis Date Noted  . Obesity 12/10/2014  . Elevated liver enzymes 12/10/2014  . Sepsis (HCC) 12/08/2014  . ARDS (adult respiratory distress syndrome) (HCC) 12/08/2014  . Acute respiratory failure with hypoxia (HCC) 12/08/2014  . Headache 12/04/2014  . Cough 12/03/2014  . Influenza with respiratory manifestations 12/03/2014  . H1N1 influenza 12/03/2014  . Viral gastroenteritis 12/02/2014  . Dehydration 12/02/2014    Past Surgical History:  Procedure Laterality Date  . SHOULDER SURGERY       OB History   None      Home Medications    Prior to Admission medications   Medication Sig Start Date End Date Taking? Authorizing Provider  acetaminophen (TYLENOL) 500 MG tablet Take 500 mg by mouth every 6 (six) hours as needed.    [provider]  chlorpheniramine-HYDROcodone (TUSSIONEX) 10-8 MG/5ML LQCR Take 5 mLs by mouth every 12 (twelve) hours as needed for cough. Patient not taking: Reported on 12/10/2014 12/09/14   Renae Fickle, MD  cyclobenzaprine (FLEXERIL) 10 MG tablet Take 1 tablet (10 mg total) by mouth 3 (three) times daily as needed for muscle  spasms. 12/06/16   [name], MD  levofloxacin (LEVAQUIN) 750 MG tablet Take 1 tablet (750 mg total) by mouth daily. 12/10/14   Hoy Register, MD  naproxen (NAPROSYN) 500 MG tablet Take 500 mg by mouth 2 (two) times daily with a meal.    [provider]  predniSONE (DELTASONE) 20 MG tablet 3 tabs po daily x 3 days, then 2 tabs x 3 days, then 1.5 tabs x 3 days, then 1 tab x 3 days, then 0.5 tabs x 3 days 12/06/16   [name], MD  traMADol (ULTRAM) 50 MG tablet Take 1 tablet (50 mg total) by mouth every 6 (six) hours as needed. 12/06/16   [name], MD    Family History History reviewed. No pertinent family history.  Social History Social History   Tobacco Use  . Smoking status: Never Smoker  . Smokeless tobacco: Never Used  Substance Use Topics  . Alcohol use: No  . Drug use: No     Allergies   Hydrocodone   Review of Systems Review of Systems  All other systems reviewed and are negative.    Physical Exam Updated Vital Signs BP 125/76 (BP Location: Right Arm)   Pulse 76   Temp 98.4 F (36.9 C) (Oral)   Resp 20   SpO2 99%   Physical Exam  Constitutional: She is oriented to person, place, and time. She appears well-developed and well-nourished.  Non-toxic  appearance. No distress.  HENT:  Head: Normocephalic and atraumatic.  Eyes: Pupils are equal, round, and reactive to light. Conjunctivae, EOM and lids are normal.  Neck: Normal range of motion. Neck supple. No tracheal deviation present. No thyroid mass present.  Cardiovascular: Normal rate, regular rhythm and normal heart sounds. Exam reveals no gallop.  No murmur heard. Pulmonary/Chest: Effort normal and breath sounds normal. No stridor. No respiratory distress. She has no decreased breath sounds. She has no wheezes. She has no rhonchi. She has no rales.  Abdominal: Soft. Normal appearance and bowel sounds are normal. She exhibits no distension. There is tenderness in the  epigastric area. There is guarding. There is no rigidity, no rebound and no CVA tenderness.    Musculoskeletal: Normal range of motion. She exhibits no edema or tenderness.  Lymphadenopathy:  3+ bilateral lower extremity pitting edema  Neurological: She is alert and oriented to person, place, and time. She has normal strength. No cranial nerve deficit or sensory deficit. GCS eye subscore is 4. GCS verbal subscore is 5. GCS motor subscore is 6.  Skin: Skin is warm and dry. No abrasion and no rash noted.  Psychiatric: She has a normal mood and affect. Her speech is normal and behavior is normal.  Nursing note and vitals reviewed.    ED Treatments / Results  Labs (all labs ordered are listed, but only abnormal results are displayed) Labs Reviewed  URINE CULTURE  CBC WITH DIFFERENTIAL/PLATELET  COMPREHENSIVE METABOLIC PANEL  LIPASE, BLOOD  URINALYSIS, ROUTINE W REFLEX MICROSCOPIC  I-STAT CG4 LACTIC ACID, ED  POC OCCULT BLOOD, ED    EKG None  Radiology No results found.  Procedures Procedures (including critical care time)  Medications Ordered in ED Medications  0.9 %  sodium chloride infusion (has no administration in time range)  ondansetron (ZOFRAN) injection 4 mg (has no administration in time range)     Initial Impression / Assessment and Plan / ED Course  I have reviewed the triage vital signs and the nursing notes.  Pertinent labs & imaging results that were available during my care of the patient were reviewed by me and considered in my medical decision making (see chart for details).     Patient given IV fluids as well as Zofran.  Nonsurgical abdomen at this time.  Abdominal CT ordered and is pending.  Dr. Preston FleetingGlick to follow-up  Final Clinical Impressions(s) / ED Diagnoses   Final diagnoses:  None    ED Discharge Orders    None       Lorre NickAllen, Domino Holten, MD 11/24/17 2349

## 2017-11-24 NOTE — ED Notes (Signed)
Went in to perform EKG, RN asked that I complete after pt returns from CT

## 2017-11-25 ENCOUNTER — Emergency Department (HOSPITAL_COMMUNITY): Payer: Self-pay

## 2017-11-25 ENCOUNTER — Encounter (HOSPITAL_COMMUNITY): Payer: Self-pay

## 2017-11-25 LAB — POC OCCULT BLOOD, ED: FECAL OCCULT BLD: NEGATIVE

## 2017-11-25 MED ORDER — ONDANSETRON HCL 4 MG/2ML IJ SOLN
4.0000 mg | Freq: Once | INTRAMUSCULAR | Status: AC
  Administered 2017-11-25: 4 mg via INTRAVENOUS
  Filled 2017-11-25: qty 2

## 2017-11-25 MED ORDER — MORPHINE SULFATE (PF) 4 MG/ML IV SOLN
4.0000 mg | Freq: Once | INTRAVENOUS | Status: AC
  Administered 2017-11-25: 4 mg via INTRAVENOUS
  Filled 2017-11-25: qty 1

## 2017-11-25 MED ORDER — IOPAMIDOL (ISOVUE-300) INJECTION 61%
100.0000 mL | Freq: Once | INTRAVENOUS | Status: AC | PRN
  Administered 2017-11-25: 100 mL via INTRAVENOUS

## 2017-11-25 MED ORDER — ONDANSETRON HCL 4 MG PO TABS: 4.0000 mg | ORAL_TABLET | Freq: Four times a day (QID) | ORAL | 0 refills | Status: AC | PRN

## 2017-11-25 MED ORDER — TRAMADOL HCL 50 MG PO TABS: 50.0000 mg | ORAL_TABLET | Freq: Four times a day (QID) | ORAL | 0 refills | Status: DC | PRN

## 2017-11-25 NOTE — Discharge Instructions (Addendum)
Si sus sntomas empeoran, regrese al [facility] geologistrealizar pruebas adicionales.

## 2017-11-25 NOTE — ED Notes (Signed)
Patient with abdominal pain and vomiting.  Care assumed from Dr. Freida Busman with labs and CT pending.  CT scan shows no acute process.  Incidental finding of bilateral nephrolithiasis.  Laboratory workup is reassuring as well.  Patient is resting comfortably.  She did receive 1 dose of morphine and 1 dose of ondansetron with good relief of pain and nausea.  Abdominal exam shows mild epigastric and right upper quadrant tenderness without rebound or guarding.  She is felt to be safe for discharge at this point.  She is discharged with prescription for ondansetron and also a small number of tramadol tablets.  Return precautions were discussed.  Patient was also advised of presence of kidney stones and potential for problems from those in the future.  Results for orders placed or performed during the hospital encounter of 11/24/17  CBC with Differential/Platelet  Result Value Ref Range   WBC 10.9 (H) 4.0 - 10.5 K/uL   RBC 4.19 3.87 - 5.11 MIL/uL   Hemoglobin 13.0 12.0 - 15.0 g/dL   HCT 65.7 84.6 - 96.2 %   MCV 96.2 78.0 - 100.0 fL   MCH 31.0 26.0 - 34.0 pg   MCHC 32.3 30.0 - 36.0 g/dL   RDW 95.2 84.1 - 32.4 %   Platelets 296 150 - 400 K/uL   Neutrophils Relative % 69 %   Neutro Abs 7.5 1.7 - 7.7 K/uL   Lymphocytes Relative 25 %   Lymphs Abs 2.7 0.7 - 4.0 K/uL   Monocytes Relative 4 %   Monocytes Absolute 0.4 0.1 - 1.0 K/uL   Eosinophils Relative 2 %   Eosinophils Absolute 0.2 0.0 - 0.7 K/uL   Basophils Relative 0 %   Basophils Absolute 0.0 0.0 - 0.1 K/uL  Comprehensive metabolic panel  Result Value Ref Range   Sodium 141 135 - 145 mmol/L   Potassium 3.5 3.5 - 5.1 mmol/L   Chloride 106 101 - 111 mmol/L   CO2 26 22 - 32 mmol/L   Glucose, Bld 105 (H) 65 - 99 mg/dL   BUN 9 6 - 20 mg/dL   Creatinine, Ser 4.01 0.44 - 1.00 mg/dL   Calcium 8.4 (L) 8.9 - 10.3 mg/dL   Total Protein 7.0 6.5 - 8.1 g/dL   Albumin 3.7 3.5 - 5.0 g/dL   AST 18 15 - 41 U/L   ALT 20 14 - 54 U/L   Alkaline Phosphatase 102  38 - 126 U/L   Total Bilirubin 0.5 0.3 - 1.2 mg/dL   GFR calc non Af Amer >60 >60 mL/min   GFR calc Af Amer >60 >60 mL/min   Anion gap 9 5 - 15  Lipase, blood  Result Value Ref Range   Lipase 22 11 - 51 U/L  Urinalysis, Routine w reflex microscopic  Result Value Ref Range   Color, Urine YELLOW YELLOW   APPearance CLEAR CLEAR   Specific Gravity, Urine 1.015 1.005 - 1.030   pH 6.0 5.0 - 8.0   Glucose, UA NEGATIVE NEGATIVE mg/dL   Hgb urine dipstick MODERATE (A) NEGATIVE   Bilirubin Urine NEGATIVE NEGATIVE   Ketones, ur NEGATIVE NEGATIVE mg/dL   Protein, ur NEGATIVE NEGATIVE mg/dL   Nitrite NEGATIVE NEGATIVE   Leukocytes, UA NEGATIVE NEGATIVE   RBC / HPF 6-30 0 - 5 RBC/hpf   WBC, UA 0-5 0 - 5 WBC/hpf   Bacteria, UA NONE SEEN NONE SEEN   Squamous Epithelial / LPF 0-5 (A) NONE SEEN   Mucus PRESENT   Brain  natriuretic peptide  Result Value Ref Range   B Natriuretic Peptide 65.9 0.0 - 100.0 pg/mL  I-Stat CG4 Lactic Acid, ED  Result Value Ref Range   Lactic Acid, Venous 1.20 0.5 - 1.9 mmol/L   Dg Chest 2 View  Result Date: 11/25/2017 CLINICAL DATA:  57 year old female with shortness of breath. EXAM: CHEST - 2 VIEW COMPARISON:  Chest radiograph dated 12/06/2016 FINDINGS: There is no focal consolidation, pleural effusion, or pneumothorax. Mild diffuse interstitial coarsening, chronic. The cardiac silhouette is within normal limits. No acute osseous pathology. IMPRESSION: No active cardiopulmonary disease. Electronically Signed   By: Elgie CollardArash  Radparvar M.D.   On: 11/25/2017 00:16   Ct Abdomen Pelvis W Contrast  Result Date: 11/25/2017 CLINICAL DATA:  Nausea and vomiting EXAM: CT ABDOMEN AND PELVIS WITH CONTRAST TECHNIQUE: Multidetector CT imaging of the abdomen and pelvis was performed using the standard protocol following bolus administration of intravenous contrast. CONTRAST:  100mL ISOVUE-300 IOPAMIDOL (ISOVUE-300) INJECTION 61% COMPARISON:  CT abdomen pelvis 08/13/2011 FINDINGS: Lower  chest: No basilar pulmonary nodules or pleural effusion. No apical pericardial effusion. Hepatobiliary: Normal hepatic contours and density. No intra- or extrahepatic biliary dilatation. Normal gallbladder. Pancreas: Normal parenchymal contours without ductal dilatation. No peripancreatic fluid collection. Spleen: Normal. Adrenals/Urinary Tract: --Adrenal glands: Normal. --Right kidney/ureter: Single nonobstructing renal calculus measuring 3 mm. No hydronephrosis, perinephric stranding or solid renal mass. --Left kidney/ureter: Single nonobstructing renal calculus measuring 4 mm. No hydronephrosis, perinephric stranding or solid renal mass. --Urinary bladder: Normal for degree of distention Stomach/Bowel: --Stomach/Duodenum: No hiatal hernia or other gastric abnormality. Normal duodenal course. --Small bowel: No dilatation or inflammation. --Colon: No focal abnormality. --Appendix: Not visualized. No right lower quadrant inflammation or free fluid. Vascular/Lymphatic: Normal course and caliber of the major abdominal vessels. No abdominal or pelvic lymphadenopathy. Reproductive: Normal uterus and ovaries. Musculoskeletal. 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 There is multilevel facet arthrosis. Other: None IMPRESSION: 1. No acute abnormality of the abdomen or pelvis. 2. Bilateral nonobstructive nephrolithiasis. 3. 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 Electronically Signed   By: Deatra RobinsonKevin  Herman M.D.   On: 11/25/2017 01:41   Images viewed by me.    Dione BoozeGlick, Trayonna Bachmeier, MD 11/25/17 [phone]

## 2017-11-26 LAB — URINE CULTURE: Culture: NO GROWTH

## 2018-09-26 ENCOUNTER — Emergency Department (HOSPITAL_COMMUNITY)
Admission: EM | Admit: 2018-09-26 | Discharge: 2018-09-26 | Disposition: A | Discharge status: Home / Self Care | Payer: Self-pay | Attending: Emergency Medicine | Admitting: Emergency Medicine

## 2018-09-26 ENCOUNTER — Encounter (HOSPITAL_COMMUNITY): Payer: Self-pay

## 2018-09-26 ENCOUNTER — Emergency Department (HOSPITAL_COMMUNITY): Payer: Self-pay

## 2018-09-26 DIAGNOSIS — N201 Calculus of ureter: Secondary | ICD-10-CM | POA: Insufficient documentation

## 2018-09-26 DIAGNOSIS — I10 Essential (primary) hypertension: Secondary | ICD-10-CM | POA: Insufficient documentation

## 2018-09-26 DIAGNOSIS — R102 Pelvic and perineal pain: Secondary | ICD-10-CM | POA: Insufficient documentation

## 2018-09-26 HISTORY — DX: Essential (primary) hypertension: I10

## 2018-09-26 LAB — BASIC METABOLIC PANEL
ANION GAP: 8 (ref 5–15)
BUN: 12 mg/dL (ref 6–20)
CO2: 26 mmol/L (ref 22–32)
Calcium: 8.7 mg/dL — ABNORMAL LOW (ref 8.9–10.3)
Chloride: 107 mmol/L (ref 98–111)
Creatinine, Ser: 0.62 mg/dL (ref 0.44–1.00)
GLUCOSE: 127 mg/dL — AB (ref 70–99)
Potassium: 3.6 mmol/L (ref 3.5–5.1)
Sodium: 141 mmol/L (ref 135–145)

## 2018-09-26 LAB — URINALYSIS, ROUTINE W REFLEX MICROSCOPIC
BILIRUBIN URINE: NEGATIVE
GLUCOSE, UA: NEGATIVE mg/dL
Ketones, ur: NEGATIVE mg/dL
LEUKOCYTES UA: NEGATIVE
Nitrite: NEGATIVE
Protein, ur: 30 mg/dL — AB
Specific Gravity, Urine: 1.015 (ref 1.005–1.030)
pH: 7 (ref 5.0–8.0)

## 2018-09-26 LAB — CBC
HCT: 41.8 % (ref 36.0–46.0)
Hemoglobin: 13.4 g/dL (ref 12.0–15.0)
MCH: 31.2 pg (ref 26.0–34.0)
MCHC: 32.1 g/dL (ref 30.0–36.0)
MCV: 97.2 fL (ref 80.0–100.0)
Platelets: 326 10*3/uL (ref 150–400)
RBC: 4.3 MIL/uL (ref 3.87–5.11)
RDW: 13.2 % (ref 11.5–15.5)
WBC: 12 10*3/uL — ABNORMAL HIGH (ref 4.0–10.5)
nRBC: 0 % (ref 0.0–0.2)

## 2018-09-26 LAB — I-STAT BETA HCG BLOOD, ED (MC, WL, AP ONLY): I-stat hCG, quantitative: <5 m[IU]/mL (ref <5)

## 2018-09-26 MED ORDER — ONDANSETRON 4 MG PO TBDP: 4.0000 mg | ORAL_TABLET | Freq: Three times a day (TID) | ORAL | 0 refills | Status: DC | PRN

## 2018-09-26 MED ORDER — ONDANSETRON HCL 4 MG/2ML IJ SOLN
4.0000 mg | Freq: Once | INTRAMUSCULAR | Status: AC
  Administered 2018-09-26: 4 mg via INTRAVENOUS
  Filled 2018-09-26: qty 2

## 2018-09-26 MED ORDER — SODIUM CHLORIDE 0.9 % IV BOLUS
1000.0000 mL | Freq: Once | INTRAVENOUS | Status: AC
  Administered 2018-09-26: 1000 mL via INTRAVENOUS

## 2018-09-26 MED ORDER — TRAMADOL HCL 50 MG PO TABS: 50.0000 mg | ORAL_TABLET | Freq: Four times a day (QID) | ORAL | 0 refills | Status: DC | PRN

## 2018-09-26 MED ORDER — KETOROLAC TROMETHAMINE 15 MG/ML IJ SOLN
15.0000 mg | Freq: Once | INTRAMUSCULAR | Status: AC
  Administered 2018-09-26: 15 mg via INTRAVENOUS
  Filled 2018-09-26: qty 1

## 2018-09-26 MED ORDER — FENTANYL CITRATE (PF) 100 MCG/2ML IJ SOLN
50.0000 ug | Freq: Once | INTRAMUSCULAR | Status: AC
  Administered 2018-09-26: 50 ug via INTRAVENOUS
  Filled 2018-09-26: qty 2

## 2018-09-26 NOTE — ED Triage Notes (Signed)
Patient BIB EMS from home with complaints of new onset sharp pain to right flank with radiation down right groin. Patient is primarily spanish speaking. Patient reports history of bladder infections. Patient has history of HTN and reports not being compliant with her medications.

## 2018-09-26 NOTE — ED Provider Notes (Signed)
[facility]-EMERGENCY DEPT Provider Note   CSN: [id number] Arrival date & time: 09/26/18  1745     History   Chief Complaint Chief Complaint  Patient presents with  . Flank Pain    HPI Colleen Jacobson is a 58 y.o. female with a past medical history of hypertension who presents to ED for 1 day history of right sided flank pain radiating to her right groin.  Reports associated nausea and one episode of diarrhea about 12 hours ago.  She has been taking ibuprofen with no improvement in her symptoms.  Denies any urinary symptoms.  No sick contacts with similar symptoms.  Denies any vaginal complaints, abnormal bleeding, alcohol, tobacco or other drug use, chronic NSAID use, history of kidney stones.  She notes that she had a bladder/kidney infection about 10 years ago.  Unsure if this feels similar.  HPI  Past Medical History:  Diagnosis Date  . Hypertension   . Obesity     Patient Active Problem List   Diagnosis Date Noted  . Obesity 12/10/2014  . Elevated liver enzymes 12/10/2014  . Sepsis (HCC) 12/08/2014  . ARDS (adult respiratory distress syndrome) (HCC) 12/08/2014  . Acute respiratory failure with hypoxia (HCC) 12/08/2014  . Headache 12/04/2014  . Cough 12/03/2014  . Influenza with respiratory manifestations 12/03/2014  . H1N1 influenza 12/03/2014  . Viral gastroenteritis 12/02/2014  . Dehydration 12/02/2014    Past Surgical History:  Procedure Laterality Date  . SHOULDER SURGERY       OB History   No obstetric history on file.      Home Medications    Prior to Admission medications   Medication Sig Start Date End Date Taking? Authorizing Provider  ibuprofen (ADVIL,MOTRIN) 200 MG tablet Take 200 mg by mouth every 6 (six) hours as needed for moderate pain.   Yes [provider]  ondansetron (ZOFRAN ODT) 4 MG disintegrating tablet Take 1 tablet (4 mg total) by mouth every 8 (eight) hours as needed for nausea or vomiting.  09/26/18   Jagdeep Ancheta, PA-C  ondansetron (ZOFRAN) 4 MG tablet Take 1 tablet (4 mg total) by mouth every 6 (six) hours as needed for nausea. Patient not taking: Reported on 09/26/2018 11/25/17   Dione Booze, MD  traMADol (ULTRAM) 50 MG tablet Take 1 tablet (50 mg total) by mouth every 6 (six) hours as needed. 09/26/18   Dietrich Pates, PA-C    Family History History reviewed. No pertinent family history.  Social History Social History   Tobacco Use  . Smoking status: Never Smoker  . Smokeless tobacco: Never Used  Substance Use Topics  . Alcohol use: No  . Drug use: No     Allergies   Hydrocodone   Review of Systems Review of Systems  Constitutional: Negative for appetite change, chills and fever.  HENT: Negative for ear pain, rhinorrhea, sneezing and sore throat.   Eyes: Negative for photophobia and visual disturbance.  Respiratory: Negative for cough, chest tightness, shortness of breath and wheezing.   Cardiovascular: Negative for chest pain and palpitations.  Gastrointestinal: Positive for abdominal pain, diarrhea and nausea. Negative for blood in stool, constipation and vomiting.  Genitourinary: Positive for flank pain. Negative for dysuria, hematuria and urgency.  Musculoskeletal: Negative for myalgias.  Skin: Negative for rash.  Neurological: Negative for dizziness, weakness and light-headedness.     Physical Exam Updated Vital Signs BP (!) 150/91   Pulse 78   Temp 98.4 F (36.9 C) (Oral)   Resp  15   SpO2 97%   Physical Exam Vitals signs and nursing note reviewed.  Constitutional:      General: She is not in acute distress.    Appearance: She is well-developed.  HENT:     Head: Normocephalic and atraumatic.     Nose: Nose normal.  Eyes:     General: No scleral icterus.       Right eye: No discharge.        Left eye: No discharge.     Conjunctiva/sclera: Conjunctivae normal.  Neck:     Musculoskeletal: Normal range of motion and neck supple.    Cardiovascular:     Rate and Rhythm: Normal rate and regular rhythm.     Heart sounds: Normal heart sounds. No murmur. No friction rub. No gallop.   Pulmonary:     Effort: Pulmonary effort is normal. No respiratory distress.     Breath sounds: Normal breath sounds.  Abdominal:     General: Bowel sounds are normal. There is no distension.     Palpations: Abdomen is soft.     Tenderness: There is abdominal tenderness (R flank, RLQ RCVA). There is no guarding or rebound.  Musculoskeletal: Normal range of motion.  Skin:    General: Skin is warm and dry.     Findings: No rash.  Neurological:     Mental Status: She is alert.     Motor: No abnormal muscle tone.     Coordination: Coordination normal.      ED Treatments / Results  Labs (all labs ordered are listed, but only abnormal results are displayed) Labs Reviewed  URINALYSIS, ROUTINE W REFLEX MICROSCOPIC - Abnormal; Notable for the following components:      Result Value   APPearance HAZY (*)    Hgb urine dipstick MODERATE (*)    Protein, ur 30 (*)    Bacteria, UA RARE (*)    All other components within normal limits  BASIC METABOLIC PANEL - Abnormal; Notable for the following components:   Glucose, Bld 127 (*)    Calcium 8.7 (*)    All other components within normal limits  CBC - Abnormal; Notable for the following components:   WBC 12.0 (*)    All other components within normal limits  I-STAT BETA HCG BLOOD, ED ([facility], [facility], [facility] ONLY)    EKG None  Radiology Ct Renal Stone Study  Result Date: 09/26/2018 CLINICAL DATA:  Lambert Mody right-sided flank pain EXAM: CT ABDOMEN AND PELVIS WITHOUT CONTRAST TECHNIQUE: Multidetector CT imaging of the abdomen and pelvis was performed following the standard protocol without IV contrast. COMPARISON:  CT 11/25/2017 FINDINGS: Lower chest: Lung bases demonstrate no acute consolidation or effusion. Heart size within normal limits. Hepatobiliary: Hepatic steatosis. No calcified gallstone or biliary  dilatation Pancreas: Unremarkable. No pancreatic ductal dilatation or surrounding inflammatory changes. Spleen: Normal in size without focal abnormality. Adrenals/Urinary Tract: Adrenal glands are normal. Small stones in the upper pole left kidney measuring up to 3 mm in size. Mild right hydronephrosis and hydroureter, secondary to a 3 mm stone in the proximal right ureter at the approximate L4 level. Bladder is unremarkable Stomach/Bowel: Stomach is within normal limits. Appendix appears normal. No evidence of bowel wall thickening, distention, or inflammatory changes. Vascular/Lymphatic: No significant vascular findings are present. No enlarged abdominal or pelvic lymph nodes. Reproductive: Uterus and bilateral adnexa are unremarkable. Other: Negative for free air or free fluid. Fat containing umbilical hernia. Musculoskeletal: Degenerative changes of the spine. Prominent posterior osteophyte at  T12-L1 with resultant canal stenosis and mass effect on the thecal sac anteriorly. IMPRESSION: 1. Mild right hydronephrosis and hydroureter, secondary to a 3 mm stone in the proximal right ureter the approximate L4 level. 2. Intrarenal stones within the left kidney. Electronically Signed   By: Jasmine PangKim  Fujinaga M.D.   On: 09/26/2018 20:27    Procedures Procedures (including critical care time)  Medications Ordered in ED Medications  sodium chloride 0.9 % bolus 1,000 mL (1,000 mLs Intravenous Bolus 09/26/18 1853)  fentaNYL (SUBLIMAZE) injection 50 mcg (50 mcg Intravenous Given 09/26/18 1854)  ondansetron (ZOFRAN) injection 4 mg (4 mg Intravenous Given 09/26/18 1853)  ketorolac (TORADOL) 15 MG/ML injection 15 mg (15 mg Intravenous Given 09/26/18 2051)     Initial Impression / Assessment and Plan / ED Course  I have reviewed the triage vital signs and the nursing notes.  Pertinent labs & imaging results that were available during my care of the patient were reviewed by me and considered in my medical decision making  (see chart for details).     58 year old female presents to ED for 1 day history of right-sided flank pain radiating to right back and right groin.  She reports associated diarrhea and nausea.  No improvement with ibuprofen.  On my exam there is tenderness palpation of the right flank, right lower quadrant without rebound or guarding.  She appears mildly uncomfortable.  Vital signs are within normal limits.  Lab work significant for leukocytosis at 12.  BMP unremarkable.  Urinalysis shows hemoglobin, RBCs.  Rare bacteria.  Will obtain CT of the abdomen pelvis to rule out acute process as cause of pain.  Given fluids, fentanyl and Zofran with improvement.  8:41 PM CT renal stone study shows R hydronephrosis caused by 3 mm stone in the proximal ureter. Toradol with improvement. Will discharge home with pain medication, nausea medication and urology follow-up.  Patient is hemodynamically stable, in NAD, and able to ambulate in the ED. Evaluation does not show pathology that would require ongoing emergent intervention or inpatient treatment. I explained the diagnosis to the patient. Pain has been managed and has no complaints prior to discharge. Patient is comfortable with above plan and is stable for discharge at this time. All questions were answered prior to disposition. Strict return precautions for returning to the ED were discussed. Encouraged follow up with PCP.    Portions of this note were generated with Scientist, clinical (histocompatibility and immunogenetics)Dragon dictation software. Dictation errors may occur despite best attempts at proofreading.  Final Clinical Impressions(s) / ED Diagnoses   Final diagnoses:  Ureterolithiasis    ED Discharge Orders         Ordered    ondansetron (ZOFRAN ODT) 4 MG disintegrating tablet  Every 8 hours PRN     09/26/18 2121    traMADol (ULTRAM) 50 MG tablet  Every 6 hours PRN     09/26/18 2121           Dietrich PatesKhatri, Allah Reason, PA-C 09/26/18 2122    Loren RacerYelverton, David, MD 09/27/18 [phone]

## 2018-09-26 NOTE — ED Notes (Signed)
Bed: WA16 Expected date:  Expected time:  Means of arrival:  Comments: EMS 

## 2018-09-26 NOTE — Discharge Instructions (Addendum)
Take Zofran as needed for nausea.  Take Percocet as needed for pain. You will need to follow-up with the urologist if your symptoms persist for 4 to 5 days. Return to ED if you start to develop a fever, worsening pain, shortness of breath, chest pain, vomiting or coughing up blood.

## 2019-08-26 ENCOUNTER — Ambulatory Visit: Payer: HRSA Program | Attending: Internal Medicine

## 2019-08-26 DIAGNOSIS — Z20822 Contact with and (suspected) exposure to covid-19: Secondary | ICD-10-CM

## 2019-08-26 DIAGNOSIS — Z20828 Contact with and (suspected) exposure to other viral communicable diseases: Secondary | ICD-10-CM | POA: Insufficient documentation

## 2019-08-27 LAB — NOVEL CORONAVIRUS, NAA: SARS-CoV-2, NAA: NOT DETECTED

## 2020-05-13 ENCOUNTER — Other Ambulatory Visit: Payer: Self-pay

## 2020-05-13 ENCOUNTER — Emergency Department (HOSPITAL_COMMUNITY)
Admission: EM | Admit: 2020-05-13 | Discharge: 2020-05-14 | Disposition: A | Discharge status: Home / Self Care | Payer: Self-pay | Attending: Emergency Medicine | Admitting: Emergency Medicine

## 2020-05-13 ENCOUNTER — Encounter (HOSPITAL_COMMUNITY): Payer: Self-pay

## 2020-05-13 DIAGNOSIS — M79604 Pain in right leg: Secondary | ICD-10-CM | POA: Insufficient documentation

## 2020-05-13 DIAGNOSIS — Z5321 Procedure and treatment not carried out due to patient leaving prior to being seen by health care provider: Secondary | ICD-10-CM | POA: Insufficient documentation

## 2020-05-13 DIAGNOSIS — M549 Dorsalgia, unspecified: Secondary | ICD-10-CM | POA: Insufficient documentation

## 2020-05-13 NOTE — ED Notes (Signed)
Pt called for triage, no answer x1 

## 2020-05-13 NOTE — ED Triage Notes (Addendum)
Pt reports R back pain that radiates down her R leg x1 month. No dysuria. Describes the pain as sharp. A&Ox4.   Leta Jungling RN

## 2020-05-14 NOTE — ED Notes (Signed)
Pt returned her stickers to registration and left d/t wait time.

## 2020-05-16 ENCOUNTER — Encounter (HOSPITAL_COMMUNITY): Payer: Self-pay

## 2020-05-16 ENCOUNTER — Other Ambulatory Visit: Payer: Self-pay

## 2020-05-16 ENCOUNTER — Emergency Department (HOSPITAL_COMMUNITY)
Admission: EM | Admit: 2020-05-16 | Discharge: 2020-05-16 | Disposition: A | Discharge status: Other Acute Inpatient Hospital | Payer: Self-pay | Attending: Emergency Medicine | Admitting: Emergency Medicine

## 2020-05-16 DIAGNOSIS — F332 Major depressive disorder, recurrent severe without psychotic features: Secondary | ICD-10-CM | POA: Insufficient documentation

## 2020-05-16 DIAGNOSIS — I10 Essential (primary) hypertension: Secondary | ICD-10-CM | POA: Insufficient documentation

## 2020-05-16 DIAGNOSIS — Y929 Unspecified place or not applicable: Secondary | ICD-10-CM | POA: Insufficient documentation

## 2020-05-16 DIAGNOSIS — R1110 Vomiting, unspecified: Secondary | ICD-10-CM | POA: Insufficient documentation

## 2020-05-16 DIAGNOSIS — Z20822 Contact with and (suspected) exposure to covid-19: Secondary | ICD-10-CM | POA: Insufficient documentation

## 2020-05-16 DIAGNOSIS — Y939 Activity, unspecified: Secondary | ICD-10-CM | POA: Insufficient documentation

## 2020-05-16 DIAGNOSIS — T505X1A Poisoning by appetite depressants, accidental (unintentional), initial encounter: Secondary | ICD-10-CM | POA: Insufficient documentation

## 2020-05-16 DIAGNOSIS — X58XXXA Exposure to other specified factors, initial encounter: Secondary | ICD-10-CM | POA: Insufficient documentation

## 2020-05-16 DIAGNOSIS — T391X1A Poisoning by 4-Aminophenol derivatives, accidental (unintentional), initial encounter: Secondary | ICD-10-CM | POA: Insufficient documentation

## 2020-05-16 DIAGNOSIS — T50902A Poisoning by unspecified drugs, medicaments and biological substances, intentional self-harm, initial encounter: Secondary | ICD-10-CM

## 2020-05-16 DIAGNOSIS — Y999 Unspecified external cause status: Secondary | ICD-10-CM | POA: Insufficient documentation

## 2020-05-16 LAB — CBC WITH DIFFERENTIAL/PLATELET
Abs Immature Granulocytes: 0.06 10*3/uL (ref 0.00–0.07)
Basophils Absolute: 0 10*3/uL (ref 0.0–0.1)
Basophils Relative: 0 %
Eosinophils Absolute: 0.3 10*3/uL (ref 0.0–0.5)
Eosinophils Relative: 3 %
HCT: 40.5 % (ref 36.0–46.0)
Hemoglobin: 13.2 g/dL (ref 12.0–15.0)
Immature Granulocytes: 1 %
Lymphocytes Relative: 38 %
Lymphs Abs: 4.5 10*3/uL — ABNORMAL HIGH (ref 0.7–4.0)
MCH: 31.6 pg (ref 26.0–34.0)
MCHC: 32.6 g/dL (ref 30.0–36.0)
MCV: 96.9 fL (ref 80.0–100.0)
Monocytes Absolute: 0.9 10*3/uL (ref 0.1–1.0)
Monocytes Relative: 8 %
Neutro Abs: 6 10*3/uL (ref 1.7–7.7)
Neutrophils Relative %: 50 %
Platelets: 332 10*3/uL (ref 150–400)
RBC: 4.18 MIL/uL (ref 3.87–5.11)
RDW: 13.1 % (ref 11.5–15.5)
WBC: 11.8 10*3/uL — ABNORMAL HIGH (ref 4.0–10.5)
nRBC: 0 % (ref 0.0–0.2)

## 2020-05-16 LAB — COMPREHENSIVE METABOLIC PANEL
ALT: 28 U/L (ref 0–44)
AST: 19 U/L (ref 15–41)
Albumin: 3.8 g/dL (ref 3.5–5.0)
Alkaline Phosphatase: 96 U/L (ref 38–126)
Anion gap: 12 (ref 5–15)
BUN: 22 mg/dL — ABNORMAL HIGH (ref 6–20)
CO2: 25 mmol/L (ref 22–32)
Calcium: 9.3 mg/dL (ref 8.9–10.3)
Chloride: 105 mmol/L (ref 98–111)
Creatinine, Ser: 0.59 mg/dL (ref 0.44–1.00)
GFR calc Af Amer: >60 mL/min (ref >60)
GFR calc non Af Amer: >60 mL/min (ref >60)
Glucose, Bld: 96 mg/dL (ref 70–99)
Potassium: 3.5 mmol/L (ref 3.5–5.1)
Sodium: 142 mmol/L (ref 135–145)
Total Bilirubin: 0.4 mg/dL (ref 0.3–1.2)
Total Protein: 7 g/dL (ref 6.5–8.1)

## 2020-05-16 LAB — RAPID URINE DRUG SCREEN, HOSP PERFORMED
Amphetamines: NOT DETECTED
Barbiturates: NOT DETECTED
Benzodiazepines: NOT DETECTED
Cocaine: NOT DETECTED
Opiates: NOT DETECTED
Tetrahydrocannabinol: NOT DETECTED

## 2020-05-16 LAB — ETHANOL: Alcohol, Ethyl (B): <10 mg/dL (ref <10)

## 2020-05-16 LAB — SALICYLATE LEVEL: Salicylate Lvl: <7 mg/dL — ABNORMAL LOW (ref 7.0–30.0)

## 2020-05-16 LAB — SARS CORONAVIRUS 2 BY RT PCR (HOSPITAL ORDER, PERFORMED IN ~~LOC~~ HOSPITAL LAB): SARS Coronavirus 2: NEGATIVE

## 2020-05-16 LAB — ACETAMINOPHEN LEVEL
Acetaminophen (Tylenol), Serum: 26 ug/mL (ref 10–30)
Acetaminophen (Tylenol), Serum: 53 ug/mL — ABNORMAL HIGH (ref 10–30)

## 2020-05-16 MED ORDER — LORAZEPAM 1 MG PO TABS: 1.0000 mg | ORAL_TABLET | Freq: Once | ORAL | Status: DC

## 2020-05-16 NOTE — Progress Notes (Signed)
Report called to [facility] at [facility]. Fax required information to [phone]. Safe Transport notified

## 2020-05-16 NOTE — BH Assessment (Addendum)
BHH Assessment Progress Note  Per Shuvon Rankin, FNP, this voluntary pt requires psychiatric hospitalization at this time.  At 15:05 Britta Mccreedy calls from [facility]  Pt has been accepted to their facility by Dr Beatris Ship to the [facility], Rm 157-1.  EDP Geoffery Lyons, MD, concurs with this disposition.  This Clinical research associate spoke to pt with the assistance of Spanish interpreter Irving Shows [phone]), and she also agrees to transfer.  Pt's nurse, Alvis Lemmings, has been notified, and agrees to call report to [phone].  Pt is to be transported via [employer].  [facility] stipulates that pt must arrive either before 23:00 tonight, or after 06:00 tomorrow.    Doylene Canning, [place] [facility] Coordinator [phone]

## 2020-05-16 NOTE — BH Assessment (Signed)
BHH Assessment Progress Note  Per Shuvon Rankin, NP, this voluntary pt requires psychiatric hospitalization at this time.  The following facilities have been contacted to seek placement for this pt, with results as noted:  Beds available, information sent, decision pending: [facility]  At capacity: [facility] (no [facility] beds)   This pt is voluntary.  If she is accepted to a facility, please discuss disposition with pt to be sure that she agrees to the plan.  If a facility agrees to accept pt and the plan changes in any way please call the facility to inform them of the change.  Final disposition is pending as of this writing.  Doylene Canning, [place] [facility] Coordinator [phone]

## 2020-05-16 NOTE — ED Triage Notes (Signed)
Patient arrived via [facility] after taking estimated 100 500mg  tylenol tablets and 30 lipozene 15mg  tablets today at 3am in attempt to harm herself. Reports one episode of vomiting.

## 2020-05-16 NOTE — ED Provider Notes (Addendum)
Care assumed from Colleen Sites, PA-C, at shift change, please see their notes for full documentation of patient's complaint/HPI. Briefly, pt here with intentional overdose by taking #100 tablets of 500 mg Tylenol and #30 tablets of 15 mg Lipozene. Results so far show elevated tylenol level at 26. Awaiting repeat tylenol level 4 hours later (at 7 AM this morning). Plan is to follow up on lab work. If Tylenol level > 150 pt will need NAC and admission. If level < 150 pt to be cleared medically and TTS consulted. She is here voluntarily.    Physical Exam  BP (!) 152/63   Pulse 82   Temp 98.3 F (36.8 C) (Oral)   Resp 16   SpO2 97%   Physical Exam Vitals and nursing note reviewed.  Constitutional:      Appearance: She is not ill-appearing.  HENT:     Head: Normocephalic and atraumatic.  Eyes:     Conjunctiva/sclera: Conjunctivae normal.  Cardiovascular:     Rate and Rhythm: Normal rate and regular rhythm.  Pulmonary:     Effort: Pulmonary effort is normal.     Breath sounds: Normal breath sounds.  Skin:    General: Skin is warm and dry.     Coloration: Skin is not jaundiced.  Neurological:     Mental Status: She is alert.     ED Course/Procedures   Clinical Course as of May 16 809  Mon May 16, 2020  0809 Acetaminophen (Tylenol), S(!): 53 [MV]    Clinical Course User Index [MV] Tanda Rockers, PA-C    Procedures  Results for orders placed or performed during the hospital encounter of 05/16/20  SARS Coronavirus 2 by RT PCR (hospital order, performed in [facility] hospital lab) Nasopharyngeal Nasopharyngeal Swab   Specimen: Nasopharyngeal Swab  Result Value Ref Range   SARS Coronavirus 2 NEGATIVE NEGATIVE  CBC with Differential  Result Value Ref Range   WBC 11.8 (H) 4.0 - 10.5 K/uL   RBC 4.18 3.87 - 5.11 MIL/uL   Hemoglobin 13.2 12.0 - 15.0 g/dL   HCT 64.1 36 - 46 %   MCV 96.9 80.0 - 100.0 fL   MCH 31.6 26.0 - 34.0 pg   MCHC 32.6 30.0 - 36.0 g/dL   RDW 58.3 09.4 -  07.6 %   Platelets 332 150 - 400 K/uL   nRBC 0.0 0.0 - 0.2 %   Neutrophils Relative % 50 %   Neutro Abs 6.0 1.7 - 7.7 K/uL   Lymphocytes Relative 38 %   Lymphs Abs 4.5 (H) 0.7 - 4.0 K/uL   Monocytes Relative 8 %   Monocytes Absolute 0.9 0 - 1 K/uL   Eosinophils Relative 3 %   Eosinophils Absolute 0.3 0 - 0 K/uL   Basophils Relative 0 %   Basophils Absolute 0.0 0 - 0 K/uL   Immature Granulocytes 1 %   Abs Immature Granulocytes 0.06 0.00 - 0.07 K/uL  Comprehensive metabolic panel  Result Value Ref Range   Sodium 142 135 - 145 mmol/L   Potassium 3.5 3.5 - 5.1 mmol/L   Chloride 105 98 - 111 mmol/L   CO2 25 22 - 32 mmol/L   Glucose, Bld 96 70 - 99 mg/dL   BUN 22 (H) 6 - 20 mg/dL   Creatinine, Ser 8.08 0.44 - 1.00 mg/dL   Calcium 9.3 8.9 - 81.1 mg/dL   Total Protein 7.0 6.5 - 8.1 g/dL   Albumin 3.8 3.5 - 5.0 g/dL   AST  19 15 - 41 U/L   ALT 28 0 - 44 U/L   Alkaline Phosphatase 96 38 - 126 U/L   Total Bilirubin 0.4 0.3 - 1.2 mg/dL   GFR calc non Af Amer >60 >60 mL/min   GFR calc Af Amer >60 >60 mL/min   Anion gap 12 5 - 15  Rapid urine drug screen (hospital performed)  Result Value Ref Range   Opiates NONE DETECTED NONE DETECTED   Cocaine NONE DETECTED NONE DETECTED   Benzodiazepines NONE DETECTED NONE DETECTED   Amphetamines NONE DETECTED NONE DETECTED   Tetrahydrocannabinol NONE DETECTED NONE DETECTED   Barbiturates NONE DETECTED NONE DETECTED  Salicylate level  Result Value Ref Range   Salicylate Lvl <7.0 (L) 7.0 - 30.0 mg/dL  Acetaminophen level  Result Value Ref Range   Acetaminophen (Tylenol), Serum 26 10 - 30 ug/mL  Ethanol  Result Value Ref Range   Alcohol, Ethyl (B) <10 <10 mg/dL  Acetaminophen level  Result Value Ref Range   Acetaminophen (Tylenol), Serum 53 (H) 10 - 30 ug/mL    MDM  Repeat Tylenol level 53. Pt is medically cleared at this time. Will have TTS come see.   On reevaluation pt states she is roughly estimating how many Tylenol she took - she  reports she had a bottle with capacity of 100 tablets; she reports the bottle was not new but was "mostly full." Given pt's Tylenol level at 53 very much doubt she took 100 tablets as we would suspect the level to be toxic with that much ingestion.   TTS has recommend inpatient treatment at this time. Home meds listed including zofran and tramadol which pt reports as not taking. She does have Ibuprofen listed PRN for pain however given recent overdose ingestion will hold off on any pain medication at this time.   This note was prepared using Dragon voice recognition software and may include unintentional dictation errors due to the inherent limitations of voice recognition software.      Tanda Rockers, PA-C 05/16/20 1451    Benjiman Core, MD 05/16/20 [phone]

## 2020-05-16 NOTE — BH Assessment (Signed)
Attempting to complete patient's TTS assessment. Patient states, "I don't understand" and "I speak very little English". Patient informs me that she speaks [place]. TTS has requested that nursing staff set up the interpreter machine to assist with language barriers.

## 2020-05-16 NOTE — ED Provider Notes (Signed)
[facility]-EMERGENCY DEPT Provider Note   CSN: [id number] Arrival date & time: 05/16/20  0357     History Chief Complaint  Patient presents with  . Drug Overdose    Colleen Jacobson is a 59 y.o. female.  The history is provided by the patient and medical records. The history is limited by a language barrier. A language interpreter was used.    59 year old female with history of hypertension, obesity, presenting to the ED with overdose.  History obtained via language interpreter as patient is primarily Spanish-speaking.  States she and her husband have decided to separate after 40 years of marriage.  States she has been having a hard time dealing with this and just did not want to live anymore.  States she was drinking alcoholic beverages tonight (sounds like flavored malt liquor beverages) and took 100 tabs of 500mg  tylenol, and 30 tabs of 15mg  lipozene.  States she was trying to kill herself because she did not want to live anymore.  She did vomit once already.  She states she feels very depressed. No hx of same, no prior psych history.  Past Medical History:  Diagnosis Date  . Hypertension   . Obesity     Patient Active Problem List   Diagnosis Date Noted  . Obesity 12/10/2014  . Elevated liver enzymes 12/10/2014  . Sepsis (HCC) 12/08/2014  . ARDS (adult respiratory distress syndrome) (HCC) 12/08/2014  . Acute respiratory failure with hypoxia (HCC) 12/08/2014  . Headache 12/04/2014  . Cough 12/03/2014  . Influenza with respiratory manifestations 12/03/2014  . H1N1 influenza 12/03/2014  . Viral gastroenteritis 12/02/2014  . Dehydration 12/02/2014    Past Surgical History:  Procedure Laterality Date  . SHOULDER SURGERY       OB History   No obstetric history on file.     No family history on file.  Social History   Tobacco Use  . Smoking status: Never Smoker  . Smokeless tobacco: Never Used  Substance Use Topics  . Alcohol use:  No  . Drug use: No    Home Medications Prior to Admission medications   Medication Sig Start Date End Date Taking? Authorizing Provider  ibuprofen (ADVIL,MOTRIN) 200 MG tablet Take 200 mg by mouth every 6 (six) hours as needed for moderate pain.    [provider]  ondansetron (ZOFRAN ODT) 4 MG disintegrating tablet Take 1 tablet (4 mg total) by mouth every 8 (eight) hours as needed for nausea or vomiting. 09/26/18   Khatri, Hina, PA-C  ondansetron (ZOFRAN) 4 MG tablet Take 1 tablet (4 mg total) by mouth every 6 (six) hours as needed for nausea. Patient not taking: Reported on 09/26/2018 11/25/17   09/28/2018, MD  traMADol (ULTRAM) 50 MG tablet Take 1 tablet (50 mg total) by mouth every 6 (six) hours as needed. 09/26/18   Khatri, Hina, PA-C    Allergies    Hydrocodone  Review of Systems   Review of Systems  Gastrointestinal: Positive for vomiting.  Psychiatric/Behavioral:       OD  All other systems reviewed and are negative.   Physical Exam Updated Vital Signs BP 131/67   Pulse 92   Temp 98.3 F (36.8 C) (Oral)   Resp 14   SpO2 97%   Physical Exam Vitals and nursing note reviewed.  Constitutional:      Appearance: She is well-developed.  HENT:     Head: Normocephalic and atraumatic.  Eyes:     Conjunctiva/sclera: Conjunctivae normal.  Pupils: Pupils are equal, round, and reactive to light.  Cardiovascular:     Rate and Rhythm: Normal rate and regular rhythm.     Heart sounds: Normal heart sounds.  Pulmonary:     Effort: Pulmonary effort is normal.     Breath sounds: Normal breath sounds. No stridor. No wheezing.  Abdominal:     General: Bowel sounds are normal.     Palpations: Abdomen is soft.  Musculoskeletal:        General: Normal range of motion.     Cervical back: Normal range of motion.  Skin:    General: Skin is warm and dry.  Neurological:     Mental Status: She is alert and oriented to person, place, and time.  Psychiatric:         Thought Content: Thought content includes suicidal ideation.     Comments: Tearful, appears depressed     ED Results / Procedures / Treatments   Labs (all labs ordered are listed, but only abnormal results are displayed) Labs Reviewed  CBC WITH DIFFERENTIAL/PLATELET - Abnormal; Notable for the following components:      Result Value   WBC 11.8 (*)    Lymphs Abs 4.5 (*)    All other components within normal limits  COMPREHENSIVE METABOLIC PANEL - Abnormal; Notable for the following components:   BUN 22 (*)    All other components within normal limits  SALICYLATE LEVEL - Abnormal; Notable for the following components:   Salicylate Lvl <7.0 (*)    All other components within normal limits  SARS CORONAVIRUS 2 BY RT PCR (HOSPITAL ORDER, PERFORMED IN [facility])  ACETAMINOPHEN LEVEL  ETHANOL  RAPID URINE DRUG SCREEN, HOSP PERFORMED  ACETAMINOPHEN LEVEL    EKG EKG Interpretation  Date/Time:  Monday May 16 2020 04:13:32 EDT Ventricular Rate:  94 PR Interval:    QRS Duration: 86 QT Interval:  351 QTC Calculation: 439 R Axis:   34 Text Interpretation: Sinus rhythm Low voltage, precordial leads No significant change was found Confirmed by Molpus, John ([id number]) on 05/16/2020 4:18:24 AM   Radiology No results found.  Procedures Procedures (including critical care time)  CRITICAL CARE Performed by: Garlon Hatchet   Total critical care time: 45 minutes  Critical care time was exclusive of separately billable procedures and treating other patients.  Critical care was necessary to treat or prevent imminent or life-threatening deterioration.  Critical care was time spent personally by me on the following activities: development of treatment plan with patient and/or surrogate as well as nursing, discussions with consultants, evaluation of patient's response to treatment, examination of patient, obtaining history from patient or surrogate, ordering and performing  treatments and interventions, ordering and review of laboratory studies, ordering and review of radiographic studies, pulse oximetry and re-evaluation of patient's condition.   Medications Ordered in ED Medications - No data to display  ED Course  I have reviewed the triage vital signs and the nursing notes.  Pertinent labs & imaging results that were available during my care of the patient were reviewed by me and considered in my medical decision making (see chart for details).    MDM Rules/Calculators/A&P  59 year old female presenting to the ED after an intentional overdose of Tylenol and Lipozene.  States she has been increasingly depressed as she and her husband are separating after 40 years together.  States she does not want to live anymore.  She did this with intention of killing herself.  Patient ingested  100 tabs of 500mg  tylenol and 30 tabs of 15mg  lipozene.  Case has been discussed with poison control-- no charcoal since she already vomited once.  4 hour APAP level at 7am and start NAC if >150.  Can continue PO fluids as lipozene can cause diarrhea.  Initial labs pending.  Patient HD stable at this time.  Initial APAP level 26.  Normal LFT's, no significant electrolyte derangement.  Remains hemodynamically stable here.  Will continue to monitor, plan for 4 hour APAP level at 7am.  Care will be signed out to oncoming provider-- plan for repeat APAP level at 7am (has been ordered).  If > 150 will need to start NAC and admit with psych eval, otherwise patient can likely be medically cleared and get TTS evaluation here in ED.  Final Clinical Impression(s) / ED Diagnoses Final diagnoses:  Intentional drug overdose, initial encounter [facility])    Rx / DC Orders ED Discharge Orders    None       , PA-C 05/16/20 0630    Molpus, Garlon Hatchet, MD 05/16/20 [phone]

## 2020-05-16 NOTE — ED Notes (Signed)
Spoke to Gage from [employer]. They recommend a 4 hour tylenol level (7a). If it is greater than 150 give acetylcysteine. Do not recommend charcoal because she vomited. Push PO fluids and expect stomach upset from the Lipozine.
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
## 2020-05-24 ENCOUNTER — Other Ambulatory Visit: Payer: Self-pay

## 2020-05-24 ENCOUNTER — Ambulatory Visit (INDEPENDENT_AMBULATORY_CARE_PROVIDER_SITE_OTHER): Payer: No Payment, Other | Admitting: Behavioral Health

## 2020-05-24 DIAGNOSIS — F332 Major depressive disorder, recurrent severe without psychotic features: Secondary | ICD-10-CM
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
## 2020-06-23 ENCOUNTER — Ambulatory Visit (INDEPENDENT_AMBULATORY_CARE_PROVIDER_SITE_OTHER): Payer: No Payment, Other | Admitting: Behavioral Health

## 2020-06-23 ENCOUNTER — Other Ambulatory Visit: Payer: Self-pay

## 2020-06-23 DIAGNOSIS — F332 Major depressive disorder, recurrent severe without psychotic features: Secondary | ICD-10-CM | POA: Diagnosis not present

## 2020-06-23 NOTE — Progress Notes (Signed)
   THERAPIST PROGRESS NOTE  Session Time: 1:00PM  Participation Level: Active  Behavioral Response: Neat and Well GroomedAlertDepressed  Type of Therapy: Individual Therapy  Treatment Goals addressed: Diagnosis: Major Depressive Disorder  Interventions: CBT  Summary: Colleen Jacobson is a 59 y.o. female who presents to [facility] for a scheduled individual therapy session. Pt was accompanied by her adult daughter who was able to translate/interpret for pt. Pt states she found out that her husband is still communicating with the other woman has chosen to continue this affair. Pt reports her depressive symptoms started to worsen after hearing this information. Pt became tearful while speaking to this writer because she states she compares herself to her husband's mistress. She reported having negative thoughts that effected her self-esteem. Pt self-reported that she stopped taking her prescribed medication (Lexapro - 10mg ) when she noticed her husband's behaviors start to improve. Patient was encouraged to continue taking her medications as prescribed. Pt stated 3 positive things about herself: "I am a good mom, I am a good grandmother, and I am a good person".  Suicidal/Homicidal: No  Therapist Response: Therapist talked about noticing red flags/depressive sxs and when to use positive self-talk to improve her mood and self-esteem. Therapist discussed the importance of taking prescribed medications. Therapist talked about ways to challenge negative thoughts by using positive affirmations.  Plan: Return again in 4 weeks.  Diagnosis: Axis I: Major Depression, Recurrent severe    Axis II: No diagnosis    , Counselor 06/23/2020

## 2020-07-06 ENCOUNTER — Other Ambulatory Visit: Payer: Self-pay

## 2020-07-06 DIAGNOSIS — Z20822 Contact with and (suspected) exposure to covid-19: Secondary | ICD-10-CM

## 2020-07-07 LAB — NOVEL CORONAVIRUS, NAA: SARS-CoV-2, NAA: NOT DETECTED

## 2020-07-07 LAB — SARS-COV-2, NAA 2 DAY TAT

## 2020-07-08 ENCOUNTER — Other Ambulatory Visit: Payer: Self-pay

## 2020-07-08 ENCOUNTER — Telehealth (HOSPITAL_COMMUNITY): Payer: No Payment, Other | Admitting: Psychiatry

## 2020-08-09 ENCOUNTER — Ambulatory Visit (HOSPITAL_COMMUNITY): Payer: No Payment, Other | Admitting: Physician Assistant

## 2020-09-22 ENCOUNTER — Encounter (HOSPITAL_COMMUNITY): Payer: Self-pay | Admitting: Psychiatry

## 2020-09-22 ENCOUNTER — Other Ambulatory Visit: Payer: Self-pay

## 2020-09-22 ENCOUNTER — Telehealth (INDEPENDENT_AMBULATORY_CARE_PROVIDER_SITE_OTHER): Payer: No Payment, Other | Admitting: Psychiatry

## 2020-09-22 DIAGNOSIS — F331 Major depressive disorder, recurrent, moderate: Secondary | ICD-10-CM

## 2020-09-22 MED ORDER — ESCITALOPRAM OXALATE 10 MG PO TABS: 10.0000 mg | ORAL_TABLET | Freq: Every day | ORAL | 2 refills | Status: AC

## 2020-09-22 MED ORDER — TRAZODONE HCL 50 MG PO TABS: 50.0000 mg | ORAL_TABLET | Freq: Every evening | ORAL | 2 refills | Status: AC | PRN

## 2020-09-22 NOTE — Progress Notes (Signed)
Psychiatric Initial Adult Assessment  Virtual Visit via Video Note  I connected with Colleen Jacobson on 09/22/20 at  9:00 AM EST by a video enabled telemedicine application and verified that I am speaking with the correct person using two identifiers.  Location: Patient: Home Provider: Clinic   I discussed the limitations of evaluation and management by telemedicine and the availability of in person appointments. The patient expressed understanding and agreed to proceed.  I provided 45 minutes of non-face-to-face time during this encounter.    Patient Identification: Colleen Jacobson MRN:  [id number] Date of Evaluation:  09/22/2020 Referral Source: Walk in  Chief Complaint:  I ran out of my medications can you refill it Visit Diagnosis:    ICD-10-CM   1. Moderate episode of recurrent major depressive disorder (HCC)  F33.1 escitalopram (LEXAPRO) 10 MG tablet    traZODone (DESYREL) 50 MG tablet    History of Present Illness:  60 year old female seen today for initial psychiatric evaluation. She has a psychiatric history of SI/SA and depression. She is currently managed on Lexapro 10 mg daily however notes that she has been out of her medication for a month.   Today she is well groomed, pleasant, cooperative, engaged in conversation, and maintained eye contact. She describes her mood as anxious and depressed. Provider conducted a GAD 7 and patient scored a 15. She notes that she constantly over thinks about her life. Provider also conducted a PHQ 9 and patient scored an 18.Patient notes that her depression started 13 years ago when her parents died. Today she endorse anhedonia, poor concentration, poor appetite and insomnia (noting she sleeps 3 hours in the day but not at night). She also endorses passive SI but denies wanting to hurt herself today. She denies SI/HI/VAH, mania, or paranoia.   She is agreeable to restart Lexapro 10 mg to help manage anxiety and depression.  She is also agreeable to start Trazodone 50 mg as needed for sleep. Potential side effects of medication and risks vs benefits of treatment vs non-treatment were explained and discussed. All questions were answered. No other concerns noted at this time   Associated Signs/Symptoms: Depression Symptoms:  depressed mood, anhedonia, insomnia, feelings of worthlessness/guilt, difficulty concentrating, suicidal thoughts without plan, anxiety, decreased appetite, (Hypo) Manic Symptoms:  Flight of Ideas, Irritable Mood, Anxiety Symptoms:  Excessive Worry, Psychotic Symptoms:  Denies PTSD Symptoms: Had a traumatic exposure:  Notes when parents passed away 13 years ago it was traumatic  Past Psychiatric History: Depression and SI  Previous Psychotropic Medications: No   Substance Abuse History in the last 12 months:  No.  Consequences of Substance Abuse: NA  Past Medical History:  Past Medical History:  Diagnosis Date  . Hypertension   . Obesity     Past Surgical History:  Procedure Laterality Date  . SHOULDER SURGERY      Family Psychiatric History: Schizophrenia sister  Family History: History reviewed. No pertinent family history.  Social History:   Social History   Socioeconomic History  . Marital status: Married    Spouse name: Not on file  . Number of children: Not on file  . Years of education: Not on file  . Highest education level: Not on file  Occupational History  . Not on file  Tobacco Use  . Smoking status: Never Smoker  . Smokeless tobacco: Never Used  Substance and Sexual Activity  . Alcohol use: No  . Drug use: No  . Sexual activity: Not on  file  Other Topics Concern  . Not on file  Social History Narrative   ** Merged History Encounter **       Social Determinants of Health   Financial Resource Strain: Not on file  Food Insecurity: Not on file  Transportation Needs: Not on file  Physical Activity: Not on file  Stress: Not on file   Social Connections: Not on file    Additional Social History: Patient is married and lives in [place]. She has 4 children. She is unemployed. She denies tobacco alcohol or illegal drug use.   Allergies:   Allergies  Allergen Reactions  . Hydrocodone Nausea And Vomiting    Metabolic Disorder Labs: No results found for: HGBA1C, MPG No results found for: PROLACTIN No results found for: CHOL, TRIG, HDL, CHOLHDL, VLDL, LDLCALC No results found for: TSH  Therapeutic Level Labs: No results found for: LITHIUM No results found for: CBMZ No results found for: VALPROATE  Current Medications: Current Outpatient Medications  Medication Sig Dispense Refill  . escitalopram (LEXAPRO) 10 MG tablet Take 1 tablet (10 mg total) by mouth daily. 30 tablet 2  . traZODone (DESYREL) 50 MG tablet Take 1 tablet (50 mg total) by mouth at bedtime as needed for sleep. 30 tablet 2  . ibuprofen (ADVIL,MOTRIN) 200 MG tablet Take 200 mg by mouth every 6 (six) hours as needed for moderate pain.    Marland Kitchen ondansetron (ZOFRAN ODT) 4 MG disintegrating tablet Take 1 tablet (4 mg total) by mouth every 8 (eight) hours as needed for nausea or vomiting. (Patient not taking: Reported on 05/16/2020) 4 tablet 0  . ondansetron (ZOFRAN) 4 MG tablet Take 1 tablet (4 mg total) by mouth every 6 (six) hours as needed for nausea. (Patient not taking: Reported on 09/26/2018) 12 tablet 0   No current facility-administered medications for this visit.    Musculoskeletal: Strength & Muscle Tone: Unable to assess due to telehealth visit Gait & Station: Unable to assess due to telehealth visit Patient leans: N/A  Psychiatric Specialty Exam: Review of Systems  There were no vitals taken for this visit.There is no height or weight on file to calculate BMI.  General Appearance: Well Groomed  Eye Contact:  Good  Speech:  Clear and Coherent and Normal Rate  Volume:  Normal  Mood:  Anxious and Depressed  Affect:  Appropriate and  Congruent  Thought Process:  Coherent, Goal Directed and Linear  Orientation:  Full (Time, Place, and Person)  Thought Content:  WDL and Logical  Suicidal Thoughts:  No  Homicidal Thoughts:  No  Memory:  Immediate;   Good Recent;   Good Remote;   Good  Judgement:  Good  Insight:  Good  Psychomotor Activity:  Normal  Concentration:  Concentration: Good and Attention Span: Good  Recall:  Good  Fund of Knowledge:Good  Language: Good  Akathisia:  No  Handed:  Right  AIMS (if indicated):Not done  Assets:  Communication Skills Desire for Improvement Financial Resources/Insurance Housing Social Support  ADL's:  Intact  Cognition: WNL  Sleep:  Poor   Screenings: GAD-7   Flowsheet Row Video Visit from 09/22/2020 in [facility]  Total GAD-7 Score 15    PHQ2-9   Flowsheet Row Video Visit from 09/22/2020 in [facility] Office Visit from 12/10/2014 in [facility]  PHQ-2 Total Score 5 0  PHQ-9 Total Score 18 9      Assessment and Plan: Patient endorses  symptoms of anxiety and depression, and poor sleep. She is agreeable to restart Lexapro 10 mg to help manage anxiety and depression. She is also agreeable to start Trazodone 50 mg as needed for sleep.   1. Moderate episode of recurrent major depressive disorder (HCC)  Restart- escitalopram (LEXAPRO) 10 MG tablet; Take 1 tablet (10 mg total) by mouth daily.  Dispense: 30 tablet; Refill: 2 Start- traZODone (DESYREL) 50 MG tablet; Take 1 tablet (50 mg total) by mouth at bedtime as needed for sleep.  Dispense: 30 tablet; Refill: 2  Follow up in 3 months Shanna Cisco, NP 1/20/20229:50 AM

## 2020-11-25 ENCOUNTER — Emergency Department (HOSPITAL_COMMUNITY)
Admission: EM | Admit: 2020-11-25 | Discharge: 2020-11-26 | Disposition: A | Discharge status: Home / Self Care | Payer: Self-pay | Attending: Emergency Medicine | Admitting: Emergency Medicine

## 2020-11-25 ENCOUNTER — Encounter (HOSPITAL_COMMUNITY): Payer: Self-pay

## 2020-11-25 ENCOUNTER — Other Ambulatory Visit: Payer: Self-pay

## 2020-11-25 DIAGNOSIS — R6883 Chills (without fever): Secondary | ICD-10-CM | POA: Insufficient documentation

## 2020-11-25 DIAGNOSIS — M542 Cervicalgia: Secondary | ICD-10-CM | POA: Insufficient documentation

## 2020-11-25 DIAGNOSIS — M546 Pain in thoracic spine: Secondary | ICD-10-CM | POA: Insufficient documentation

## 2020-11-25 DIAGNOSIS — I10 Essential (primary) hypertension: Secondary | ICD-10-CM | POA: Insufficient documentation

## 2020-11-25 DIAGNOSIS — R112 Nausea with vomiting, unspecified: Secondary | ICD-10-CM | POA: Insufficient documentation

## 2020-11-25 DIAGNOSIS — R1084 Generalized abdominal pain: Secondary | ICD-10-CM | POA: Insufficient documentation

## 2020-11-25 LAB — COMPREHENSIVE METABOLIC PANEL
ALT: 26 U/L (ref 0–44)
AST: 21 U/L (ref 15–41)
Albumin: 3.6 g/dL (ref 3.5–5.0)
Alkaline Phosphatase: 85 U/L (ref 38–126)
Anion gap: 6 (ref 5–15)
BUN: 18 mg/dL (ref 6–20)
CO2: 30 mmol/L (ref 22–32)
Calcium: 9 mg/dL (ref 8.9–10.3)
Chloride: 103 mmol/L (ref 98–111)
Creatinine, Ser: 0.69 mg/dL (ref 0.44–1.00)
GFR, Estimated: >60 mL/min (ref >60)
Glucose, Bld: 105 mg/dL — ABNORMAL HIGH (ref 70–99)
Potassium: 3.8 mmol/L (ref 3.5–5.1)
Sodium: 139 mmol/L (ref 135–145)
Total Bilirubin: 0.6 mg/dL (ref 0.3–1.2)
Total Protein: 6.7 g/dL (ref 6.5–8.1)

## 2020-11-25 LAB — LIPASE, BLOOD: Lipase: 28 U/L (ref 11–51)

## 2020-11-25 LAB — URINALYSIS, ROUTINE W REFLEX MICROSCOPIC
Bilirubin Urine: NEGATIVE
Glucose, UA: NEGATIVE mg/dL
Ketones, ur: NEGATIVE mg/dL
Leukocytes,Ua: NEGATIVE
Nitrite: NEGATIVE
Protein, ur: NEGATIVE mg/dL
RBC / HPF: >50 RBC/hpf — ABNORMAL HIGH (ref 0–5)
Specific Gravity, Urine: 1.024 (ref 1.005–1.030)
pH: 6 (ref 5.0–8.0)

## 2020-11-25 LAB — I-STAT BETA HCG BLOOD, ED (MC, WL, AP ONLY): I-stat hCG, quantitative: <5 m[IU]/mL (ref <5)

## 2020-11-25 LAB — CBC
HCT: 42.4 % (ref 36.0–46.0)
Hemoglobin: 13.8 g/dL (ref 12.0–15.0)
MCH: 31.2 pg (ref 26.0–34.0)
MCHC: 32.5 g/dL (ref 30.0–36.0)
MCV: 95.9 fL (ref 80.0–100.0)
Platelets: 359 10*3/uL (ref 150–400)
RBC: 4.42 MIL/uL (ref 3.87–5.11)
RDW: 13.3 % (ref 11.5–15.5)
WBC: 12.4 10*3/uL — ABNORMAL HIGH (ref 4.0–10.5)
nRBC: 0 % (ref 0.0–0.2)

## 2020-11-25 MED ORDER — ONDANSETRON HCL 4 MG/2ML IJ SOLN
4.0000 mg | Freq: Once | INTRAMUSCULAR | Status: AC
  Administered 2020-11-25: 4 mg via INTRAVENOUS
  Filled 2020-11-25: qty 2

## 2020-11-25 MED ORDER — OXYCODONE-ACETAMINOPHEN 5-325 MG PO TABS
1.0000 | ORAL_TABLET | Freq: Once | ORAL | Status: AC
  Administered 2020-11-25: 1 via ORAL
  Filled 2020-11-25: qty 1

## 2020-11-25 NOTE — ED Provider Notes (Signed)
[facility] EMERGENCY DEPARTMENT Provider Note   CSN: [id number] Arrival date & time: 11/25/20  2047     History Chief Complaint  Patient presents with  . Abdominal Pain    Colleen Jacobson is a 60 y.o. female.  Patient with history of HTN presents for evaluation of nausea and vomiting that started this morning, followed by generalized abdominal pain. She states she feels her stomach is distended. No hematemesis. She has felt chills but has had no definite fever. No diarrhea, urinary symptoms. She also reports pain in her upper back and neck that has been constant for the past 2 weeks. No cough, congestion, SOB, DOE. The pain is worse with certain positions. No injury. She has been taking ibuprofen daily, reporting 600 mg three times daily.   The history is provided by the patient. A language interpreter was used.  Abdominal Pain Associated symptoms: chills, nausea and vomiting   Associated symptoms: no chest pain, no cough, no diarrhea, no dysuria, no fever and no shortness of breath        Past Medical History:  Diagnosis Date  . Hypertension   . Obesity     Patient Active Problem List   Diagnosis Date Noted  . Obesity 12/10/2014  . Elevated liver enzymes 12/10/2014  . Sepsis (HCC) 12/08/2014  . ARDS (adult respiratory distress syndrome) (HCC) 12/08/2014  . Acute respiratory failure with hypoxia (HCC) 12/08/2014  . Headache 12/04/2014  . Cough 12/03/2014  . Influenza with respiratory manifestations 12/03/2014  . H1N1 influenza 12/03/2014  . Viral gastroenteritis 12/02/2014  . Dehydration 12/02/2014    Past Surgical History:  Procedure Laterality Date  . SHOULDER SURGERY       OB History   No obstetric history on file.     History reviewed. No pertinent family history.  Social History   Tobacco Use  . Smoking status: Never Smoker  . Smokeless tobacco: Never Used  Substance Use Topics  . Alcohol use: No  . Drug use: No     Home Medications Prior to Admission medications   Medication Sig Start Date End Date Taking? Authorizing Provider  escitalopram (LEXAPRO) 10 MG tablet Take 1 tablet (10 mg total) by mouth daily. 09/22/20   Shanna Cisco, NP  ibuprofen (ADVIL,MOTRIN) 200 MG tablet Take 200 mg by mouth every 6 (six) hours as needed for moderate pain.    [provider]  ondansetron (ZOFRAN ODT) 4 MG disintegrating tablet Take 1 tablet (4 mg total) by mouth every 8 (eight) hours as needed for nausea or vomiting. Patient not taking: Reported on 05/16/2020 09/26/18   Dietrich Pates, PA-C  ondansetron (ZOFRAN) 4 MG tablet Take 1 tablet (4 mg total) by mouth every 6 (six) hours as needed for nausea. Patient not taking: Reported on 09/26/2018 11/25/17   Dione Booze, MD  traZODone (DESYREL) 50 MG tablet Take 1 tablet (50 mg total) by mouth at bedtime as needed for sleep. 09/22/20   Shanna Cisco, NP    Allergies    Hydrocodone  Review of Systems   Review of Systems  Constitutional: Positive for chills. Negative for fever.  HENT: Negative.   Respiratory: Negative.  Negative for cough and shortness of breath.   Cardiovascular: Negative for chest pain.  Gastrointestinal: Positive for abdominal pain, nausea and vomiting. Negative for diarrhea.  Genitourinary: Negative.  Negative for dysuria.  Musculoskeletal: Positive for back pain and neck pain.  Skin: Negative.   Neurological: Negative for weakness and numbness.  Physical Exam Updated Vital Signs BP (!) 147/92 (BP Location: Left Arm)   Pulse 78   Temp 98.3 F (36.8 C) (Oral)   Resp (!) 22   SpO2 96%   Physical Exam Vitals and nursing note reviewed.  Constitutional:      General: She is not in acute distress.    Appearance: She is well-developed. She is obese.  HENT:     Head: Normocephalic and atraumatic.     Mouth/Throat:     Mouth: Mucous membranes are moist.  Cardiovascular:     Rate and Rhythm: Normal rate and regular  rhythm.     Heart sounds: No murmur heard.   Pulmonary:     Effort: Pulmonary effort is normal.     Breath sounds: No wheezing, rhonchi or rales.  Abdominal:     Palpations: Abdomen is soft.     Tenderness: There is generalized abdominal tenderness.  Musculoskeletal:       Back:     Comments: Tender to upper back and neck in midline and paraspinal areas. No swelling, redness. Equal and symmetric strength in bilateral UE's.   Skin:    General: Skin is warm and dry.  Neurological:     Mental Status: She is alert.     Sensory: No sensory deficit.     ED Results / Procedures / Treatments   Labs (all labs ordered are listed, but only abnormal results are displayed) Labs Reviewed  COMPREHENSIVE METABOLIC PANEL - Abnormal; Notable for the following components:      Result Value   Glucose, Bld 105 (*)    All other components within normal limits  CBC - Abnormal; Notable for the following components:   WBC 12.4 (*)    All other components within normal limits  URINALYSIS, ROUTINE W REFLEX MICROSCOPIC - Abnormal; Notable for the following components:   APPearance CLOUDY (*)    Hgb urine dipstick LARGE (*)    RBC / HPF >50 (*)    Bacteria, UA RARE (*)    All other components within normal limits  LIPASE, BLOOD  I-STAT BETA HCG BLOOD, ED ([facility], [facility], [facility] ONLY)   Results for orders placed or performed during the hospital encounter of 11/25/20  Lipase, blood  Result Value Ref Range   Lipase 28 11 - 51 U/L  Comprehensive metabolic panel  Result Value Ref Range   Sodium 139 135 - 145 mmol/L   Potassium 3.8 3.5 - 5.1 mmol/L   Chloride 103 98 - 111 mmol/L   CO2 30 22 - 32 mmol/L   Glucose, Bld 105 (H) 70 - 99 mg/dL   BUN 18 6 - 20 mg/dL   Creatinine, Ser 0.86 0.44 - 1.00 mg/dL   Calcium 9.0 8.9 - 76.1 mg/dL   Total Protein 6.7 6.5 - 8.1 g/dL   Albumin 3.6 3.5 - 5.0 g/dL   AST 21 15 - 41 U/L   ALT 26 0 - 44 U/L   Alkaline Phosphatase 85 38 - 126 U/L   Total Bilirubin 0.6 0.3 -  1.2 mg/dL   GFR, Estimated >95 >09 mL/min   Anion gap 6 5 - 15  CBC  Result Value Ref Range   WBC 12.4 (H) 4.0 - 10.5 K/uL   RBC 4.42 3.87 - 5.11 MIL/uL   Hemoglobin 13.8 12.0 - 15.0 g/dL   HCT 32.6 71.2 - 45.8 %   MCV 95.9 80.0 - 100.0 fL   MCH 31.2 26.0 - 34.0 pg   MCHC  32.5 30.0 - 36.0 g/dL   RDW 27.5 17.0 - 01.7 %   Platelets 359 150 - 400 K/uL   nRBC 0.0 0.0 - 0.2 %  Urinalysis, Routine w reflex microscopic Urine, Clean Catch  Result Value Ref Range   Color, Urine YELLOW YELLOW   APPearance CLOUDY (A) CLEAR   Specific Gravity, Urine 1.024 1.005 - 1.030   pH 6.0 5.0 - 8.0   Glucose, UA NEGATIVE NEGATIVE mg/dL   Hgb urine dipstick LARGE (A) NEGATIVE   Bilirubin Urine NEGATIVE NEGATIVE   Ketones, ur NEGATIVE NEGATIVE mg/dL   Protein, ur NEGATIVE NEGATIVE mg/dL   Nitrite NEGATIVE NEGATIVE   Leukocytes,Ua NEGATIVE NEGATIVE   RBC / HPF >50 (H) 0 - 5 RBC/hpf   WBC, UA 0-5 0 - 5 WBC/hpf   Bacteria, UA RARE (A) NONE SEEN   Squamous Epithelial / LPF 0-5 0 - 5   Mucus PRESENT   I-Stat beta hCG blood, ED  Result Value Ref Range   I-stat hCG, quantitative <5.0 <5 mIU/mL   Comment 3            EKG None  Radiology No results found.  Procedures Procedures   Medications Ordered in ED Medications  oxyCODONE-acetaminophen (PERCOCET/ROXICET) 5-325 MG per tablet 1 tablet (has no administration in time range)    ED Course  I have reviewed the triage vital signs and the nursing notes.  Pertinent labs & imaging results that were available during my care of the patient were reviewed by me and considered in my medical decision making (see chart for details).    MDM Rules/Calculators/A&P                          Patient to ED for evaluation of N, V, generalized abdominal pain x 1 day. She also reports upper back and neck pain x 2 weeks. Taking ibuprofen daily for back pain. No history of PUD.  VSS, labs are essentially unremarkable. There is hematuria of >50. On chart  review, this is consistent with previous studies.  Back and neck pain felt to be MSK pain. Radiculopathy considered, however, no UE weakness, pain or numbness. Doubt dissection, infection, ACS, PE.   She has been taking daily ibuprofen. This is potentially responsible for her nausea and vomiting.   Pain and nausea medication provided. Abdominal series shows mild constipation only.   On re-evaluation, the symptoms are improved. Via interpreter, outlined plan of discharge and specific return precautions. Patient does not have PCP - will provide referral. All questions addressed. Patient has no concerns regarding discharge home.   Final Clinical Impression(s) / ED Diagnoses Final diagnoses:  None   1. Back pain 2. Generalized abdominal pain 3. Nausea and vomiting  Rx / DC Orders ED Discharge Orders    None       Danne Harbor 11/26/20 0116    [name], MD 11/26/20 [phone]

## 2020-11-25 NOTE — ED Triage Notes (Signed)
Pt c/o abdominal pain, vomiting and headache that started this morning.  Pt states she has vomited 5 times.  Pt denies diarrhea or fever.

## 2020-11-26 ENCOUNTER — Emergency Department (HOSPITAL_COMMUNITY): Payer: Self-pay

## 2020-11-26 MED ORDER — ONDANSETRON 4 MG PO TBDP: 4.0000 mg | ORAL_TABLET | Freq: Three times a day (TID) | ORAL | 0 refills | Status: AC | PRN

## 2020-11-26 MED ORDER — OXYCODONE-ACETAMINOPHEN 5-325 MG PO TABS: 1.0000 | ORAL_TABLET | ORAL | 0 refills | Status: AC | PRN

## 2020-11-26 NOTE — Discharge Instructions (Addendum)
Take medications for pain and nausea as prescribed.   Return to the emergency department if your pain becomes severe, you have any difficulty breathing, run a fever, have vomiting that is not controlled by medications, or for new concern.

## 2020-12-21 ENCOUNTER — Telehealth (HOSPITAL_COMMUNITY): Payer: No Payment, Other | Admitting: Psychiatry

## 2020-12-21 ENCOUNTER — Other Ambulatory Visit: Payer: Self-pay

## 2021-01-18 ENCOUNTER — Other Ambulatory Visit: Payer: Self-pay

## 2021-01-18 ENCOUNTER — Telehealth (HOSPITAL_COMMUNITY): Payer: No Payment, Other | Admitting: Psychiatry

## 2021-06-24 IMAGING — CR DG ABDOMEN ACUTE W/ 1V CHEST
4 series · 4 of 4 positions shown · non-contrast
Comparison: None.

CLINICAL DATA: Back and abdominal pain

EXAM:
DG ABDOMEN ACUTE WITH 1 VIEW CHEST

[chest pa]
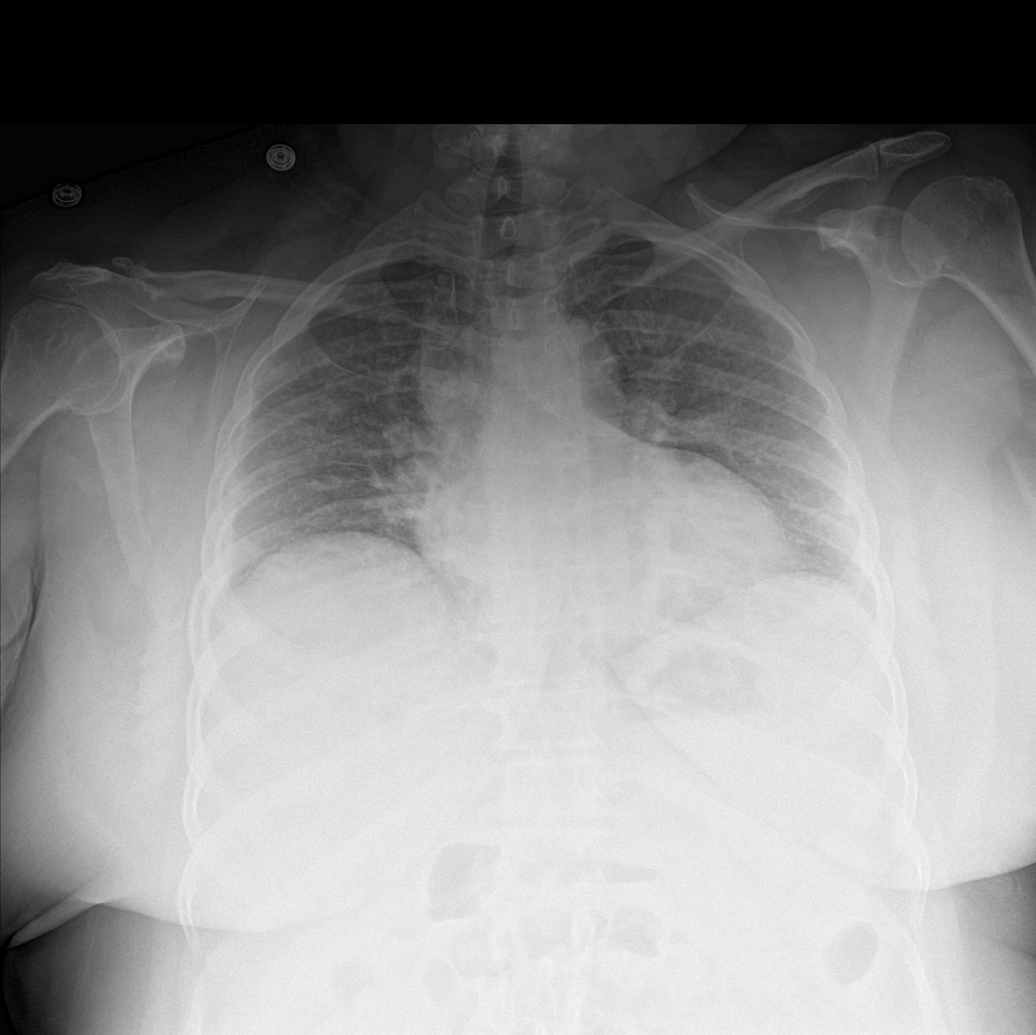

[abdomen erect]
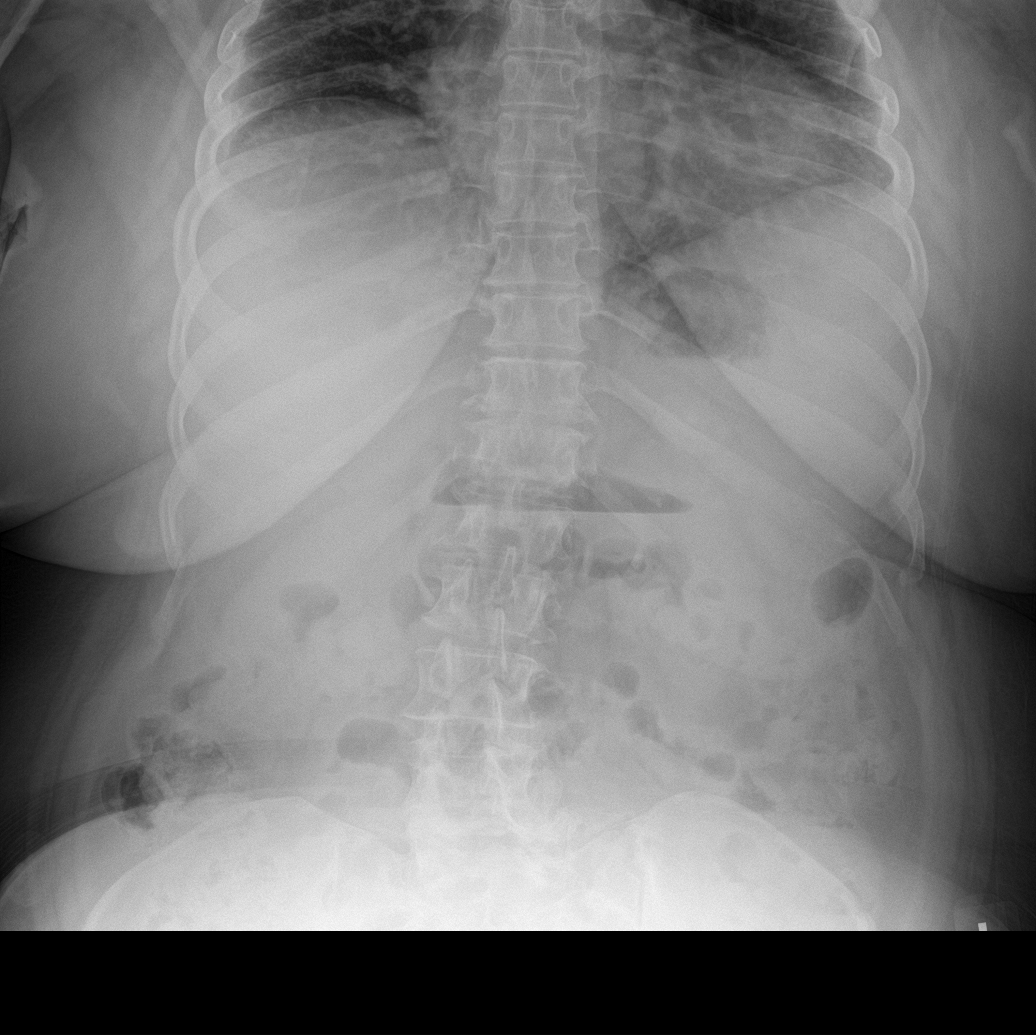

[abdomen supine (1 of 2)]
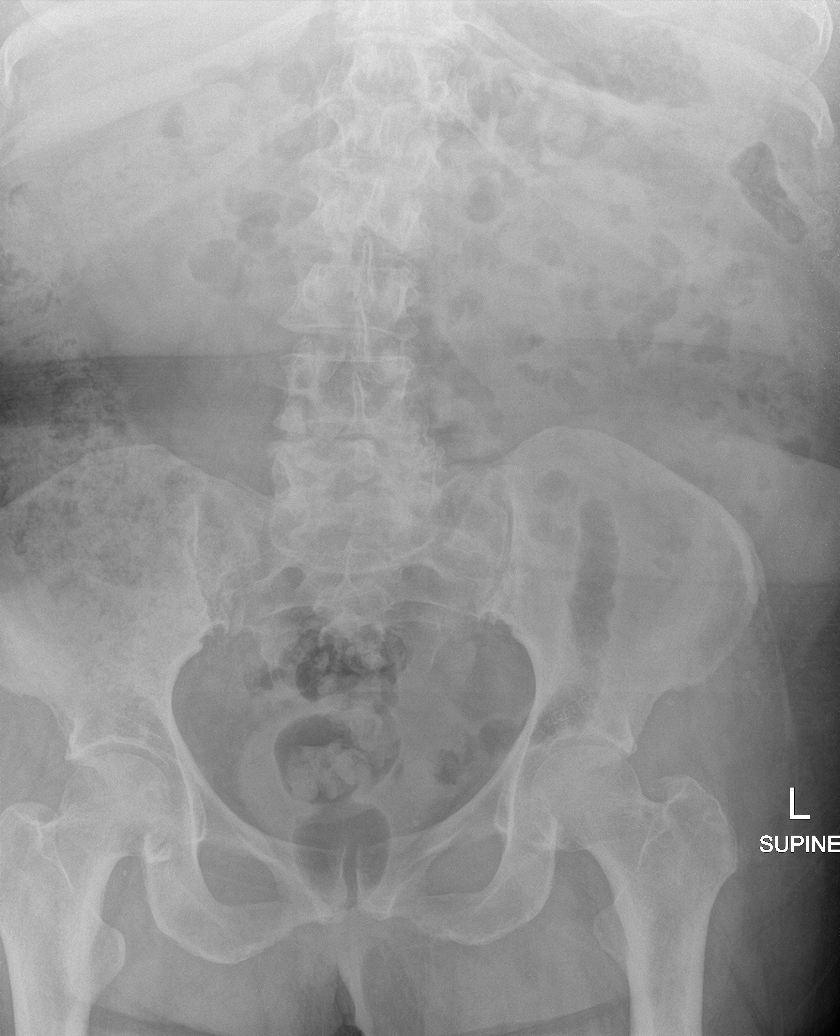

[abdomen supine (2 of 2)]
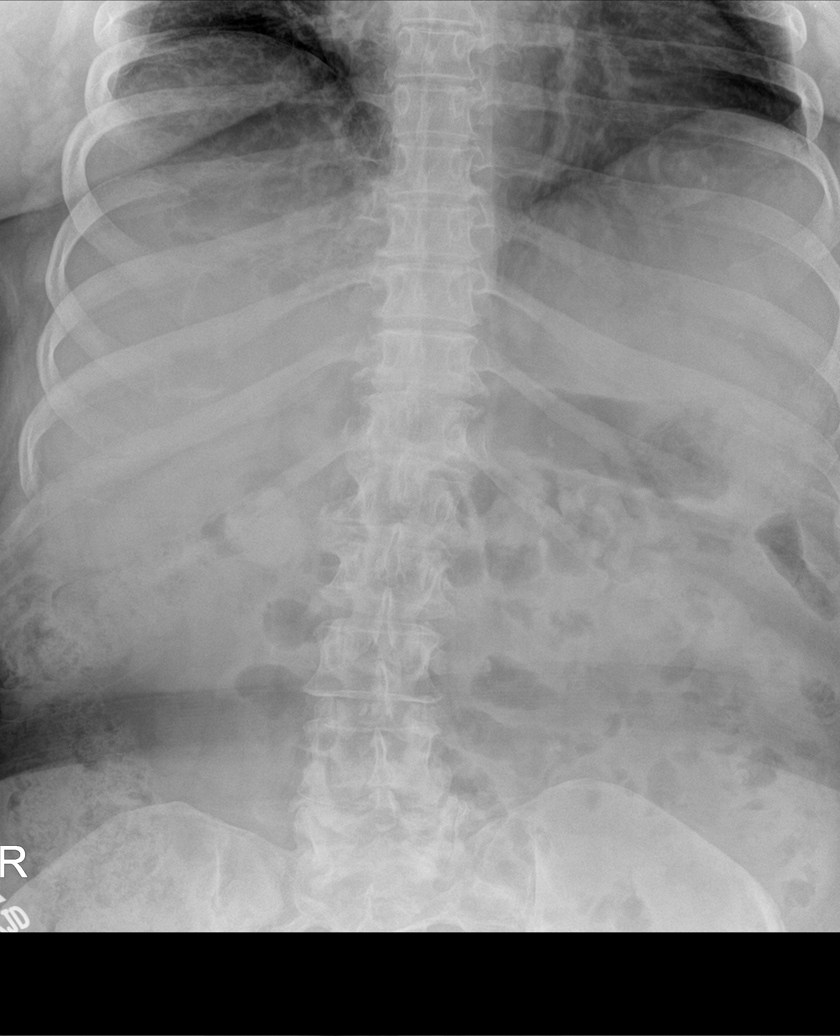

[4 of 4 positions shown; findings below may reference images not displayed]

FINDINGS: Cardiac shadow is within normal limits. Lungs are hypoinflated with
crowding of the vascular markings. No focal infiltrate or effusion
is seen. No bony abnormality is noted.

Scattered large and small bowel gas is noted. Mild retained fecal
material is seen without obstructive change. No acute bony
abnormality is noted. No free air is seen.
IMPRESSION: Mild retained fecal material.  No other focal abnormality is noted.

## 2023-01-19 ENCOUNTER — Other Ambulatory Visit: Payer: Self-pay

## 2023-01-19 ENCOUNTER — Emergency Department (HOSPITAL_COMMUNITY): Payer: Self-pay

## 2023-01-19 ENCOUNTER — Emergency Department (HOSPITAL_COMMUNITY)
Admission: EM | Admit: 2023-01-19 | Discharge: 2023-01-20 | Disposition: A | Discharge status: Home / Self Care | Payer: Self-pay | Attending: Emergency Medicine | Admitting: Emergency Medicine

## 2023-01-19 DIAGNOSIS — H6093 Unspecified otitis externa, bilateral: Secondary | ICD-10-CM | POA: Insufficient documentation

## 2023-01-19 DIAGNOSIS — R251 Tremor, unspecified: Secondary | ICD-10-CM | POA: Insufficient documentation

## 2023-01-19 DIAGNOSIS — H60503 Unspecified acute noninfective otitis externa, bilateral: Secondary | ICD-10-CM

## 2023-01-19 DIAGNOSIS — R519 Headache, unspecified: Secondary | ICD-10-CM | POA: Insufficient documentation

## 2023-01-19 DIAGNOSIS — R0602 Shortness of breath: Secondary | ICD-10-CM | POA: Insufficient documentation

## 2023-01-19 LAB — CBC
HCT: 43.5 % (ref 36.0–46.0)
Hemoglobin: 14 g/dL (ref 12.0–15.0)
MCH: 31.2 pg (ref 26.0–34.0)
MCHC: 32.2 g/dL (ref 30.0–36.0)
MCV: 96.9 fL (ref 80.0–100.0)
Platelets: 375 10*3/uL (ref 150–400)
RBC: 4.49 MIL/uL (ref 3.87–5.11)
RDW: 13.2 % (ref 11.5–15.5)
WBC: 13.4 10*3/uL — ABNORMAL HIGH (ref 4.0–10.5)
nRBC: 0 % (ref 0.0–0.2)

## 2023-01-19 LAB — URINALYSIS, ROUTINE W REFLEX MICROSCOPIC
Bacteria, UA: NONE SEEN
Bilirubin Urine: NEGATIVE
Glucose, UA: NEGATIVE mg/dL
Ketones, ur: NEGATIVE mg/dL
Leukocytes,Ua: NEGATIVE
Nitrite: NEGATIVE
Protein, ur: NEGATIVE mg/dL
Specific Gravity, Urine: 1.006 (ref 1.005–1.030)
pH: 6 (ref 5.0–8.0)

## 2023-01-19 LAB — TROPONIN I (HIGH SENSITIVITY): Troponin I (High Sensitivity): 4 ng/L (ref <18)

## 2023-01-19 LAB — BASIC METABOLIC PANEL
Anion gap: 10 (ref 5–15)
BUN: 14 mg/dL (ref 6–20)
CO2: 22 mmol/L (ref 22–32)
Calcium: 8.9 mg/dL (ref 8.9–10.3)
Chloride: 104 mmol/L (ref 98–111)
Creatinine, Ser: 0.8 mg/dL (ref 0.44–1.00)
GFR, Estimated: >60 mL/min (ref >60)
Glucose, Bld: 94 mg/dL (ref 70–99)
Potassium: 3.8 mmol/L (ref 3.5–5.1)
Sodium: 136 mmol/L (ref 135–145)

## 2023-01-19 MED ORDER — CIPROFLOXACIN-DEXAMETHASONE 0.3-0.1 % OT SUSP: 4.0000 [drp] | Freq: Two times a day (BID) | OTIC | 0 refills | Status: AC

## 2023-01-19 MED ORDER — DIPHENHYDRAMINE HCL 50 MG/ML IJ SOLN
12.5000 mg | Freq: Once | INTRAMUSCULAR | Status: AC
  Administered 2023-01-19: 12.5 mg via INTRAVENOUS
  Filled 2023-01-19: qty 1

## 2023-01-19 MED ORDER — PROCHLORPERAZINE EDISYLATE 10 MG/2ML IJ SOLN
10.0000 mg | Freq: Once | INTRAMUSCULAR | Status: AC
  Administered 2023-01-19: 10 mg via INTRAVENOUS
  Filled 2023-01-19: qty 2

## 2023-01-19 NOTE — ED Triage Notes (Signed)
Pt arrives with c/o dizziness and headache that started a few days ago. Pt endorse bilateral ear fullness and drainage. Per pt, the drainage from her ears is blood tinged. Pt a&ox4.

## 2023-01-19 NOTE — Discharge Instructions (Addendum)
  Tome los antibiticos segn lo recetado. Haga un seguimiento con Engineering geologist, llame para programar una cita con Engineering geologist. Regrese a la sala de emergencias si los sntomas empeoran o son preocupantes.  Take antibiotics as prescribed. Follow up with ENT, please call to schedule an appointment with the ear doctor.  Return to the ER for worsening or concerning symptoms.

## 2023-01-19 NOTE — ED Provider Notes (Signed)
62yo female with ear fullness/pain a few days ago, drainage from ears. Feels gait is off- "feeling off." B/L mastoid tenderness without overlying changes.  Compazine and benadryl, sleeping. Looks like external otitis. If CT ok, can possibly be dc with abx for OE. Recheck gait.  Physical Exam  BP (!) 157/73 (BP Location: Right Arm)   Pulse 78   Temp 98.1 F (36.7 C) (Oral)   Resp 18   Ht 5\' 1"  (1.549 m)   Wt 113.4 kg   SpO2 96%   BMI 47.24 kg/m   Physical Exam Mastoids non tender, no overlying erythema, no displacement of the ears Procedures  Procedures  ED Course / MDM    Medical Decision Making Amount and/or Complexity of Data Reviewed Labs: ordered. Radiology: ordered.  Risk Prescription drug management.   Patient reports feeling better. Plan is to treat with antibiotics and refer to ENT for follow up with return to ER precautions.        Jeannie Fend, PA-C 01/20/23 0121    [name], MD 01/20/23 [phone]

## 2023-01-19 NOTE — ED Provider Notes (Cosign Needed Addendum)
[facility] EMERGENCY DEPARTMENT AT [facility] Provider Note   CSN: [id number] Arrival date & time: 01/19/23  2105    History  Chief Complaint  Patient presents with   Dizziness    Colleen Jacobson is a 62 y.o. female no significant past medical history here for evaluation multiple complaints. 3 days ago developed fullness to her bilateral ears, 1 day later developed pain to her bilateral ears as well as yellow purulent drainage, left greater than right.  Today noticed some blood tinge in her ears.  States she has a severe headache.  She has also noted some shortness of breath without chest pain.  No cough, fever, nausea, vomiting, abdominal pain.  No PND, orthopnea, swelling to extremities.  She is not anticoagulated.  No recent injury or trauma.  No numbness or tingling to her extremities.  She does not typically get headaches.  She denies any sudden onset thunderclap headache.  She feels generally fatigued.  No known sick contacts.  No congestion or rhinorrhea.  States she is also been feeling more tremulous to bilateral upper and lower extremities.  HPI     Home Medications Prior to Admission medications   Medication Sig Start Date End Date Taking? Authorizing Provider  ciprofloxacin-dexamethasone (CIPRODEX) OTIC suspension Place 4 drops into both ears 2 (two) times daily for 7 days. 01/19/23 01/26/23 Yes Barth Trella A, PA-C  escitalopram (LEXAPRO) 10 MG tablet Take 1 tablet (10 mg total) by mouth daily. 09/22/20   Shanna Cisco, NP  ibuprofen (ADVIL,MOTRIN) 200 MG tablet Take 200 mg by mouth every 6 (six) hours as needed for moderate pain.    [provider]  ondansetron (ZOFRAN ODT) 4 MG disintegrating tablet Take 1 tablet (4 mg total) by mouth every 8 (eight) hours as needed for nausea or vomiting. 11/26/20   Elpidio Anis, PA-C  ondansetron (ZOFRAN) 4 MG tablet Take 1 tablet (4 mg total) by mouth every 6 (six) hours as needed for nausea. 11/25/17    Dione Booze, MD  oxyCODONE-acetaminophen (PERCOCET/ROXICET) 5-325 MG tablet Take 1 tablet by mouth every 4 (four) hours as needed for severe pain. 11/26/20   Elpidio Anis, PA-C  traZODone (DESYREL) 50 MG tablet Take 1 tablet (50 mg total) by mouth at bedtime as needed for sleep. 09/22/20   Shanna Cisco, NP      Allergies    Hydrocodone    Review of Systems   Review of Systems  Constitutional:  Positive for fatigue.  HENT:  Positive for ear discharge, ear pain and hearing loss. Negative for facial swelling, nosebleeds, postnasal drip, rhinorrhea, sinus pressure, sinus pain, sneezing, sore throat, tinnitus, trouble swallowing and voice change.   Respiratory:  Positive for shortness of breath. Negative for apnea, cough, choking, chest tightness, wheezing and stridor.   Cardiovascular: Negative.   Gastrointestinal: Negative.   Genitourinary: Negative.   Neurological:  Positive for dizziness, tremors, numbness and headaches. Negative for seizures, syncope, facial asymmetry, speech difficulty, weakness and light-headedness.  All other systems reviewed and are negative.   Physical Exam Updated Vital Signs BP (!) 174/89 (BP Location: Right Arm)   Pulse 90   Temp 98.5 F (36.9 C)   Resp 18   Ht 5\' 1"  (1.549 m)   Wt 113.4 kg   SpO2 96%   BMI 47.24 kg/m  Physical Exam Vitals and nursing note reviewed.  Constitutional:      General: She is not in acute distress.    Appearance: She is  well-developed. She is not ill-appearing, toxic-appearing or diaphoretic.  HENT:     Head: Normocephalic and atraumatic.     Ears:     Comments: Right TM bulging, grey in color, scant drainage mid ear Left mid ear edematous, yellow drainage macerated tissue. Tenderness to BIL mastoid without overlying erythema, warmth    Nose: Nose normal.     Mouth/Throat:     Mouth: Mucous membranes are moist.  Eyes:     Pupils: Pupils are equal, round, and reactive to light.  Cardiovascular:     Rate and  Rhythm: Normal rate.     Pulses: Normal pulses.     Heart sounds: Normal heart sounds.  Pulmonary:     Effort: Pulmonary effort is normal. No respiratory distress.     Breath sounds: Normal breath sounds.  Abdominal:     General: Bowel sounds are normal. There is no distension.     Palpations: Abdomen is soft.     Tenderness: There is no abdominal tenderness. There is no right CVA tenderness, left CVA tenderness, guarding or rebound.  Musculoskeletal:        General: No swelling, tenderness, deformity or signs of injury. Normal range of motion.     Cervical back: Normal range of motion.     Right lower leg: No edema.     Left lower leg: No edema.  Skin:    General: Skin is warm and dry.     Capillary Refill: Capillary refill takes less than 2 seconds.  Neurological:     General: No focal deficit present.     Mental Status: She is alert and oriented to person, place, and time.     Comments: CN 2-12 grossly intact Equal hand grip Intact sensation Ambulates without ataxia  Psychiatric:        Mood and Affect: Mood normal.     Comments: Appear anxious     ED Results / Procedures / Treatments   Labs (all labs ordered are listed, but only abnormal results are displayed) Labs Reviewed  CBC - Abnormal; Notable for the following components:      Result Value   WBC 13.4 (*)    All other components within normal limits  URINALYSIS, ROUTINE W REFLEX MICROSCOPIC - Abnormal; Notable for the following components:   Color, Urine STRAW (*)    APPearance HAZY (*)    Hgb urine dipstick MODERATE (*)    All other components within normal limits  BASIC METABOLIC PANEL  TROPONIN I (HIGH SENSITIVITY)  TROPONIN I (HIGH SENSITIVITY)    EKG None  Radiology DG Chest 2 View  Result Date: 01/19/2023 CLINICAL DATA:  Dizziness, headache EXAM: CHEST - 2 VIEW COMPARISON:  11/26/2020 FINDINGS: Frontal and lateral views of the chest demonstrate a stable cardiac silhouette. Stable diffuse  interstitial prominence likely related to scarring. No acute airspace disease, effusion, or pneumothorax. No acute bony abnormalities. IMPRESSION: 1. Chronic parenchymal lung scarring.  No acute airspace disease. Electronically Signed   By: Sharlet Salina M.D.   On: 01/19/2023 21:48    Procedures Procedures    Medications Ordered in ED Medications  diphenhydrAMINE (BENADRYL) injection 12.5 mg (12.5 mg Intravenous Given 01/19/23 2251)  prochlorperazine (COMPAZINE) injection 10 mg (10 mg Intravenous Given 01/19/23 2251)   ED Course/ Medical Decision Making/ A&P   62 year old here for evaluation multiple complaints.  Developed ear fullness, pain and drainage over 2 days.  Associated severe headache per patient however denies any onset thunderclap headache.  She has  pain to her bilateral mastoids without overlying skin changes.  States her gait feels "off" however she is ambulatory that ataxic gait.  She has a nonfocal neuroexam without deficits.  Admits to some shortness of breath without chest pain.  No cough, fever.  She does not appear grossly fluid overloaded.  She is Wells criteria low risk.  She does not typically get dyspnea on exertion or exertional chest pain at baseline.  Will plan on labs and imaging  Labs and imaging personally viewed and interpreted:  CBC leukocytosis 13.4 Metabolic panel without significant abnormality Troponin 4 UA negative for infection Chest x-ray without cardiomegaly, pulm edema, pneumothorax  2340: Patient reassessed. Sleepy. Pending CT imaging.  Care transferred to [facility], [place] who will FU on remaining labs, imaging and determine dispo.                             Medical Decision Making Amount and/or Complexity of Data Reviewed External Data Reviewed: labs, radiology, ECG and notes. Labs: ordered. Decision-making details documented in ED Course. Radiology: ordered and independent interpretation performed. Decision-making details documented in ED  Course. ECG/medicine tests: ordered and independent interpretation performed. Decision-making details documented in ED Course.  Risk OTC drugs. Prescription drug management. Parenteral controlled substances. Decision regarding hospitalization. Diagnosis or treatment significantly limited by social determinants of health.     Final Clinical Impression(s) / ED Diagnoses Final diagnoses:  Acute otitis externa of both ears, unspecified type  Acute nonintractable headache, unspecified headache type  SOB (shortness of breath)    Rx / DC Orders ED Discharge Orders          Ordered    ciprofloxacin-dexamethasone (CIPRODEX) OTIC suspension  2 times daily        01/19/23 2303               Tiffany Calmes A, PA-C 01/19/23 2357    Linwood Dibbles, MD 01/21/23 [phone]

## 2023-01-20 LAB — TROPONIN I (HIGH SENSITIVITY): Troponin I (High Sensitivity): 4 ng/L (ref <18)

## 2023-01-20 MED ORDER — AMOXICILLIN-POT CLAVULANATE 875-125 MG PO TABS
1.0000 | ORAL_TABLET | Freq: Once | ORAL | Status: AC
  Administered 2023-01-20: 1 via ORAL
  Filled 2023-01-20: qty 1

## 2023-01-20 MED ORDER — AMOXICILLIN-POT CLAVULANATE 875-125 MG PO TABS: 1.0000 | ORAL_TABLET | Freq: Two times a day (BID) | ORAL | 0 refills | Status: AC

## 2024-10-08 ENCOUNTER — Encounter (HOSPITAL_BASED_OUTPATIENT_CLINIC_OR_DEPARTMENT_OTHER): Payer: Self-pay

## 2024-10-08 ENCOUNTER — Emergency Department (HOSPITAL_BASED_OUTPATIENT_CLINIC_OR_DEPARTMENT_OTHER)
Admission: EM | Admit: 2024-10-08 | Discharge: 2024-10-08 | Disposition: A | Discharge status: Home / Self Care | Payer: Self-pay | Source: Home / Self Care | Attending: Emergency Medicine | Admitting: Emergency Medicine

## 2024-10-08 ENCOUNTER — Other Ambulatory Visit: Payer: Self-pay

## 2024-10-08 DIAGNOSIS — K625 Hemorrhage of anus and rectum: Secondary | ICD-10-CM

## 2024-10-08 LAB — CBC WITH DIFFERENTIAL/PLATELET
Abs Immature Granulocytes: 0.06 10*3/uL (ref 0.00–0.07)
Basophils Absolute: 0.1 10*3/uL (ref 0.0–0.1)
Basophils Relative: 0 %
Eosinophils Absolute: 0.3 10*3/uL (ref 0.0–0.5)
Eosinophils Relative: 2 %
HCT: 39.9 % (ref 36.0–46.0)
Hemoglobin: 13.1 g/dL (ref 12.0–15.0)
Immature Granulocytes: 0 %
Lymphocytes Relative: 28 %
Lymphs Abs: 4.1 10*3/uL — ABNORMAL HIGH (ref 0.7–4.0)
MCH: 28.9 pg (ref 26.0–34.0)
MCHC: 32.8 g/dL (ref 30.0–36.0)
MCV: 88.1 fL (ref 80.0–100.0)
Monocytes Absolute: 0.8 10*3/uL (ref 0.1–1.0)
Monocytes Relative: 6 %
Neutro Abs: 9.1 10*3/uL — ABNORMAL HIGH (ref 1.7–7.7)
Neutrophils Relative %: 64 %
Platelets: 394 10*3/uL (ref 150–400)
RBC: 4.53 MIL/uL (ref 3.87–5.11)
RDW: 14.6 % (ref 11.5–15.5)
WBC: 14.4 10*3/uL — ABNORMAL HIGH (ref 4.0–10.5)
nRBC: 0 % (ref 0.0–0.2)

## 2024-10-08 LAB — COMPREHENSIVE METABOLIC PANEL WITH GFR
ALT: 30 U/L (ref 0–44)
AST: 27 U/L (ref 15–41)
Albumin: 4.1 g/dL (ref 3.5–5.0)
Alkaline Phosphatase: 117 U/L (ref 38–126)
Anion gap: 12 (ref 5–15)
BUN: 13 mg/dL (ref 8–23)
CO2: 28 mmol/L (ref 22–32)
Calcium: 10 mg/dL (ref 8.9–10.3)
Chloride: 99 mmol/L (ref 98–111)
Creatinine, Ser: 0.68 mg/dL (ref 0.44–1.00)
GFR, Estimated: >60 mL/min
Glucose, Bld: 89 mg/dL (ref 70–99)
Potassium: 3.7 mmol/L (ref 3.5–5.1)
Sodium: 139 mmol/L (ref 135–145)
Total Bilirubin: 0.6 mg/dL (ref 0.0–1.2)
Total Protein: 7.8 g/dL (ref 6.5–8.1)

## 2024-10-08 LAB — PROTIME-INR
INR: 1 (ref 0.8–1.2)
Prothrombin Time: 13.9 s (ref 11.4–15.2)

## 2024-10-08 LAB — OCCULT BLOOD X 1 CARD TO LAB, STOOL: Fecal Occult Bld: POSITIVE — AB

## 2024-10-08 NOTE — ED Provider Notes (Signed)
 " [place] [facility] AT [facility] Provider Note   CSN: [id number] Arrival date & time: 10/08/24  1627     Patient presents with: Rectal Bleeding  HPI Colleen Jacobson is a 64 y.o. female with history of hypertension presenting for rectal bleeding.  She reports that has been going on for a year but noticeably worse in the last 3 weeks.  The blood is bright red in color.  Denies abdominal or flank pain.  Denies chest pain, shortness of breath, fatigue or syncope.  Denies pain with defecation. Denies excessive NSAID, tylenol , alcohol use.  Past Medical History:  Diagnosis Date   Hypertension    Obesity        Rectal Bleeding      Prior to Admission medications  Medication Sig Start Date End Date Taking? Authorizing Provider  amoxicillin -clavulanate (AUGMENTIN ) 875-125 MG tablet Take 1 tablet by mouth every 12 (twelve) hours. 01/20/23   Beverley Leita LABOR, PA-C  escitalopram  (LEXAPRO ) 10 MG tablet Take 1 tablet (10 mg total) by mouth daily. 09/22/20   Harl Zane BRAVO, NP  ibuprofen  (ADVIL ,MOTRIN ) 200 MG tablet Take 200 mg by mouth every 6 (six) hours as needed for moderate pain.    [provider]  ondansetron  (ZOFRAN  ODT) 4 MG disintegrating tablet Take 1 tablet (4 mg total) by mouth every 8 (eight) hours as needed for nausea or vomiting. 11/26/20   Odell Balls, PA-C  ondansetron  (ZOFRAN ) 4 MG tablet Take 1 tablet (4 mg total) by mouth every 6 (six) hours as needed for nausea. 11/25/17   Raford Lenis, MD  oxyCODONE -acetaminophen  (PERCOCET/ROXICET) 5-325 MG tablet Take 1 tablet by mouth every 4 (four) hours as needed for severe pain. 11/26/20   Odell Balls, PA-C  traZODone  (DESYREL ) 50 MG tablet Take 1 tablet (50 mg total) by mouth at bedtime as needed for sleep. 09/22/20   Harl Zane BRAVO, NP    Allergies: Hydrocodone     Review of Systems  Gastrointestinal:  Positive for hematochezia.    Updated Vital Signs BP 130/82   Pulse 73    Temp 97.8 F (36.6 C)   Resp 18   SpO2 92%   Physical Exam Vitals and nursing note reviewed. Exam conducted with a chaperone present.  HENT:     Head: Normocephalic and atraumatic.     Mouth/Throat:     Mouth: Mucous membranes are moist.  Eyes:     General:        Right eye: No discharge.        Left eye: No discharge.     Conjunctiva/sclera: Conjunctivae normal.  Cardiovascular:     Rate and Rhythm: Normal rate and regular rhythm.     Pulses: Normal pulses.     Heart sounds: Normal heart sounds.  Pulmonary:     Effort: Pulmonary effort is normal.     Breath sounds: Normal breath sounds.  Abdominal:     General: Abdomen is flat. There is no distension.     Palpations: Abdomen is soft.     Tenderness: There is no abdominal tenderness.  Genitourinary:    Rectum: Guaiac result positive. No mass, tenderness, anal fissure or external hemorrhoid. Normal anal tone.  Skin:    General: Skin is warm and dry.  Neurological:     General: No focal deficit present.  Psychiatric:        Mood and Affect: Mood normal.     (all labs ordered are listed, but only abnormal results are displayed)  Labs Reviewed  CBC WITH DIFFERENTIAL/PLATELET - Abnormal; Notable for the following components:      Result Value   WBC 14.4 (*)    Neutro Abs 9.1 (*)    Lymphs Abs 4.1 (*)    All other components within normal limits  OCCULT BLOOD X 1 CARD TO LAB, STOOL - Abnormal; Notable for the following components:   Fecal Occult Bld POSITIVE (*)    All other components within normal limits  COMPREHENSIVE METABOLIC PANEL WITH GFR  PROTIME-INR    EKG: None  Radiology: No results found.   Procedures   Medications Ordered in the ED - No data to display                                  Medical Decision Making Amount and/or Complexity of Data Reviewed Labs: ordered.   Initial Impression and Ddx 64 year old well-appearing female presenting for rectal bleeding.  Exam was mostly unremarkable  but she was Hemoccult positive.  DDx includes upper versus lower GI bleed, symptomatic anemia, electrolyte derangement, other. Patient PMH that increases complexity of ED encounter:  HTN  Interpretation of Diagnostics - I independent reviewed and interpreted the labs as followed: Hemoccult positive, Hgb 13.1, WBC 14.4 (slightly elevated above baseline)  Patient Reassessment and Ultimate Disposition/Management On reassessment still without symptoms.  Workup does not suggest acute GI bleed or symptomatic anemia.  Given the persistence of her symptoms, advised her to follow-up with GI in the outpatient setting.  Discussed return precautions.  Discharged.  Patient management required discussion with the following services or consulting groups:  None  Complexity of Problems Addressed Acute complicated illness or Injury  Additional Data Reviewed and Analyzed Further history obtained from: Past medical history and medications listed in the EMR and Prior ED visit notes  Patient Encounter Risk Assessment Consideration of hospitalization      Final diagnoses:  Rectal bleeding    ED Discharge Orders     None          Lang Norleen POUR, PA-C 10/08/24 2029    Doretha Folks, MD 10/08/24 2053  "

## 2024-10-08 NOTE — ED Notes (Addendum)
 Reviewed discharge instructions and follow-up care with pt and daughter. Both verbalized understanding and had no further questions. Pt exited ED without complications.

## 2024-10-08 NOTE — ED Triage Notes (Signed)
 Patient arrives stating she is having rectal bleeding. Started out as spotting for bleeding for a year, but has increased bleeding recently within the last 3 weeks. She also reports even when she just urinates and she wipes she notices there is blood.

## 2024-10-08 NOTE — Discharge Instructions (Addendum)
 Evaluation today was overall reassuring.  You do have some blood in your stool but at this time feel you are safe for discharge provided that you follow-up with a GI doctor for further evaluation.  If your bleeding gets worse, you develop shortness of breath or chest pain or passout or any other concerning symptom please return to the ED for further evaluation.
